# Patient Record
Sex: Female | Born: 1965 | Race: Black or African American | Hispanic: No | Marital: Single | Smoking: Current every day smoker
Health system: Southern US, Community
[De-identification: ages and names within clinical notes are randomized; demographics above are authoritative.]

## PROBLEM LIST (undated history)

## (undated) DIAGNOSIS — K219 Gastro-esophageal reflux disease without esophagitis: Secondary | ICD-10-CM

## (undated) DIAGNOSIS — M1990 Unspecified osteoarthritis, unspecified site: Secondary | ICD-10-CM

## (undated) HISTORY — PX: DILATION AND CURETTAGE OF UTERUS: SHX78

## (undated) HISTORY — PX: TONSILLECTOMY: SUR1361

---

## 2015-01-23 ENCOUNTER — Emergency Department (HOSPITAL_COMMUNITY)
Admission: EM | Admit: 2015-01-23 | Discharge: 2015-01-23 | Disposition: A | Discharge status: Home / Self Care | Payer: BLUE CROSS/BLUE SHIELD | Attending: Emergency Medicine | Admitting: Emergency Medicine

## 2015-01-23 ENCOUNTER — Encounter (HOSPITAL_COMMUNITY): Payer: Self-pay

## 2015-01-23 ENCOUNTER — Emergency Department (HOSPITAL_COMMUNITY): Payer: BLUE CROSS/BLUE SHIELD

## 2015-01-23 DIAGNOSIS — R110 Nausea: Secondary | ICD-10-CM | POA: Diagnosis not present

## 2015-01-23 DIAGNOSIS — R109 Unspecified abdominal pain: Secondary | ICD-10-CM | POA: Insufficient documentation

## 2015-01-23 DIAGNOSIS — R42 Dizziness and giddiness: Secondary | ICD-10-CM | POA: Diagnosis not present

## 2015-01-23 DIAGNOSIS — R079 Chest pain, unspecified: Secondary | ICD-10-CM | POA: Diagnosis present

## 2015-01-23 LAB — BASIC METABOLIC PANEL
Anion gap: 9 (ref 5–15)
BUN: 10 mg/dL (ref 6–20)
CO2: 25 mmol/L (ref 22–32)
Calcium: 8.9 mg/dL (ref 8.9–10.3)
Chloride: 104 mmol/L (ref 101–111)
Creatinine, Ser: 0.74 mg/dL (ref 0.44–1.00)
GFR calc Af Amer: >60 mL/min (ref >60)
GFR calc non Af Amer: >60 mL/min (ref >60)
Glucose, Bld: 108 mg/dL — ABNORMAL HIGH (ref 65–99)
Potassium: 3.4 mmol/L — ABNORMAL LOW (ref 3.5–5.1)
Sodium: 138 mmol/L (ref 135–145)

## 2015-01-23 LAB — CBC WITH DIFFERENTIAL/PLATELET
Basophils Absolute: 0 10*3/uL (ref 0.0–0.1)
Basophils Relative: 1 % (ref 0–1)
Eosinophils Absolute: 0.1 10*3/uL (ref 0.0–0.7)
Eosinophils Relative: 1 % (ref 0–5)
HCT: 36.9 % (ref 36.0–46.0)
Hemoglobin: 12.1 g/dL (ref 12.0–15.0)
Lymphocytes Relative: 22 % (ref 12–46)
Lymphs Abs: 1.8 10*3/uL (ref 0.7–4.0)
MCH: 26.5 pg (ref 26.0–34.0)
MCHC: 32.8 g/dL (ref 30.0–36.0)
MCV: 80.9 fL (ref 78.0–100.0)
Monocytes Absolute: 0.5 10*3/uL (ref 0.1–1.0)
Monocytes Relative: 6 % (ref 3–12)
Neutro Abs: 5.9 10*3/uL (ref 1.7–7.7)
Neutrophils Relative %: 70 % (ref 43–77)
Platelets: 309 10*3/uL (ref 150–400)
RBC: 4.56 MIL/uL (ref 3.87–5.11)
RDW: 14.2 % (ref 11.5–15.5)
WBC: 8.4 10*3/uL (ref 4.0–10.5)

## 2015-01-23 LAB — I-STAT TROPONIN, ED
Troponin i, poc: 0 ng/mL (ref 0.00–0.08)
Troponin i, poc: 0 ng/mL (ref 0.00–0.08)

## 2015-01-23 LAB — HEPATIC FUNCTION PANEL
ALT: 14 U/L (ref 14–54)
AST: 16 U/L (ref 15–41)
Albumin: 3.6 g/dL (ref 3.5–5.0)
Alkaline Phosphatase: 113 U/L (ref 38–126)
Bilirubin, Direct: 0.1 mg/dL (ref 0.1–0.5)
Indirect Bilirubin: 0.4 mg/dL (ref 0.3–0.9)
Total Bilirubin: 0.5 mg/dL (ref 0.3–1.2)
Total Protein: 8.1 g/dL (ref 6.5–8.1)

## 2015-01-23 LAB — LIPASE, BLOOD: Lipase: 19 U/L — ABNORMAL LOW (ref 22–51)

## 2015-01-23 MED ORDER — ONDANSETRON HCL 4 MG/2ML IJ SOLN
4.0000 mg | Freq: Once | INTRAMUSCULAR | Status: AC
  Administered 2015-01-23: 4 mg via INTRAVENOUS
  Filled 2015-01-23: qty 2

## 2015-01-23 MED ORDER — NITROGLYCERIN 0.4 MG SL SUBL
0.4000 mg | SUBLINGUAL_TABLET | SUBLINGUAL | Status: DC | PRN
  Filled 2015-01-23: qty 1

## 2015-01-23 NOTE — ED Provider Notes (Signed)
CSN: [id number]     Arrival date & time 01/23/15  0016 History  This chart was scribed for Patricia Rhine, MD by Placido Sou, ED scribe. This patient was seen in room APA04/APA04 and the patient's care was started at 12:25 AM.   Chief Complaint  Patient presents with  . Chest Pain   Patient is a 49 y.o. female presenting with chest pain. The history is provided by the patient. No language interpreter was used.  Chest Pain Pain location:  L chest Pain quality: pressure   Pain radiates to:  Does not radiate Pain severity:  Moderate Onset quality:  Sudden Duration:  2 hours Timing:  Constant Progression:  Unchanged Chronicity:  New Relieved by:  Nitroglycerin and aspirin Associated symptoms: abdominal pain, dizziness and nausea   Associated symptoms: not vomiting   Risk factors: obesity and smoking   Risk factors: no coronary artery disease and no prior DVT/PE     HPI Comments: Patricia Thompson is a 49 y.o. female, with a hx of smoking, who presents to the Emergency Department by ambulance complaining of constant, moderate, left sided chest pain with onset PTA. Pt describes her pain as a pressure,and further notes associated nausea, abd pain and dizziness. Pt notes taking 4 baby aspirin PTA and per EMS was given 1 sl nitro which provided some improvement of her pain. She denies any hx of DM, cardiac issues or DVT/PE. Pt denies any hematemesis or leg swelling.   PMH -none Soc hx - smoker Social History  Substance Use Topics  . Smoking status: Not on file  . Smokeless tobacco: Not on file  . Alcohol Use: Not on file   OB History    No data available     Review of Systems  Cardiovascular: Positive for chest pain. Negative for leg swelling.  Gastrointestinal: Positive for nausea and abdominal pain. Negative for vomiting.  Neurological: Positive for dizziness.  All other systems reviewed and are negative.  Allergies  Review of patient's allergies indicates not on  file.  Home Medications   Prior to Admission medications   Not on File   BP 138/87 mmHg  Pulse 63  Temp(Src) 98.1 F (36.7 C) (Oral)  Resp 24  Ht 5\' 1"  (1.549 m)  Wt 220 lb (99.791 kg)  BMI 41.59 kg/m2  SpO2 98% Physical Exam CONSTITUTIONAL: Well developed/well nourished HEAD: Normocephalic/atraumatic EYES: EOMI/PERRL ENMT: Mucous membranes moist NECK: supple no meningeal signs SPINE/BACK:mild diffuse tenderness CV: S1/S2 noted, no murmurs/rubs/gallops noted LUNGS: Lungs are clear to auscultation bilaterally, no apparent distress ABDOMEN: soft, nontender, no rebound or guarding, bowel sounds noted throughout abdomen GU:no cva tenderness NEURO: Pt is awake/alert/appropriate, moves all extremitiesx4.  No facial droop.   EXTREMITIES: pulses normal/equal, full ROM; no edema or calf tenderness noted SKIN: warm, color normal PSYCH: no abnormalities of mood noted, alert and oriented to situation ED Course  Procedures  DIAGNOSTIC STUDIES: Oxygen Saturation is 98% on RA, normal by my interpretation.    COORDINATION OF CARE: 12:29 AM Discussed treatment plan with pt at bedside and pt agreed to plan. 1:43 AM On repeat assessment, pt has very reproducible chest wall pain She reports this is the pain she had earlier She first had palpitations then had chest wall pain Low suspicion for ACS at this time She is low risk for PE I doubt aortic dissection given history/exam 3:26 AM Pt sleeping at this time 4:03 AM Repeat troponin negative Repeat EKG unchanged Pt sleeping on reassessment She reports mild  CP that is worsened with palpation  with some areas of reproducible CP Her main complaint earlier was palpitations Currently she is well appearing/stable I feel she is safe for d/c We discussed strict return precautions Labs Review Labs Reviewed  BASIC METABOLIC PANEL - Abnormal; Notable for the following:    Potassium 3.4 (*)    Glucose, Bld 108 (*)    All other components  within normal limits  LIPASE, BLOOD - Abnormal; Notable for the following:    Lipase 19 (*)    All other components within normal limits  CBC WITH DIFFERENTIAL/PLATELET  HEPATIC FUNCTION PANEL  I-STAT TROPOININ, ED  Rosezena Sensor, ED    Imaging Review Dg Chest 2 View  01/23/2015   CLINICAL DATA:  Acute onset of generalized chest pain, dizziness and nausea. Initial encounter.  EXAM: CHEST  2 VIEW  COMPARISON:  Chest radiograph performed 04/26/2013  FINDINGS: The lungs are well-aerated. Mild vascular congestion is noted, without significant pulmonary edema. There is no evidence of pleural effusion or pneumothorax.  The heart is mildly enlarged. No acute osseous abnormalities are seen.  IMPRESSION: Mild vascular congestion and mild cardiomegaly, without significant pulmonary edema.   Electronically Signed   By: Patricia Thompson M.D.   On: 01/23/2015 02:00   I have personally reviewed and evaluated these images and lab results as part of my medical decision-making.   EKG Interpretation   Date/Time:  Thursday January 23 2015 00:23:52 EDT Ventricular Rate:  72 PR Interval:  160 QRS Duration: 85 QT Interval:  405 QTC Calculation: 443 R Axis:   68 Text Interpretation:  Sinus rhythm Borderline repolarization abnormality  Abnormal ekg No previous ECGs available Confirmed by Bebe Shaggy  MD, DONALD  [phone]) on 01/23/2015 12:33:29 AM      EKG Interpretation  Date/Time:  Thursday January 23 2015 03:21:25 EDT Ventricular Rate:  76 PR Interval:  191 QRS Duration: 86 QT Interval:  415 QTC Calculation: 467 R Axis:   75 Text Interpretation:  Sinus rhythm Nonspecific T abnormalities, inferior leads No significant change since last tracing Confirmed by Bebe Shaggy  MD, Patricia Thompson ([id number]) on 01/23/2015 3:25:49 AM       MDM   Final diagnoses:  Chest pain, unspecified chest pain type    Nursing notes including past medical history and social history reviewed and considered in  documentation xrays/imaging reviewed by myself and considered during evaluation   I personally performed the services described in this documentation, which was scribed in my presence. The recorded information has been reviewed and is accurate.      Patricia Rhine, MD 01/23/15 [phone]

## 2015-01-23 NOTE — Discharge Instructions (Signed)

## 2015-01-23 NOTE — ED Notes (Signed)
Chest pain started approx 6 pm, 4 baby aspirin and 1 sl nitro for pain per ems with some improvement of pain from 6/10 to 3/10.  Pt also had some dizziness and nausea.

## 2018-06-07 HISTORY — PX: COLONOSCOPY: SHX174

## 2018-08-07 ENCOUNTER — Encounter (HOSPITAL_COMMUNITY)
Admission: RE | Admit: 2018-08-07 | Discharge: 2018-08-07 | Disposition: A | Discharge status: Home / Self Care | Payer: BLUE CROSS/BLUE SHIELD | Source: Ambulatory Visit | Attending: Orthopedic Surgery | Admitting: Orthopedic Surgery

## 2018-08-07 ENCOUNTER — Other Ambulatory Visit: Payer: Self-pay

## 2018-08-07 ENCOUNTER — Encounter (HOSPITAL_COMMUNITY): Payer: Self-pay

## 2018-08-07 DIAGNOSIS — Z01812 Encounter for preprocedural laboratory examination: Secondary | ICD-10-CM | POA: Diagnosis not present

## 2018-08-07 DIAGNOSIS — M1711 Unilateral primary osteoarthritis, right knee: Secondary | ICD-10-CM | POA: Insufficient documentation

## 2018-08-07 HISTORY — DX: Gastro-esophageal reflux disease without esophagitis: K21.9

## 2018-08-07 HISTORY — DX: Unspecified osteoarthritis, unspecified site: M19.90

## 2018-08-07 LAB — COMPREHENSIVE METABOLIC PANEL
ALT: 24 U/L (ref 0–44)
AST: 22 U/L (ref 15–41)
Albumin: 3.8 g/dL (ref 3.5–5.0)
Alkaline Phosphatase: 114 U/L (ref 38–126)
Anion gap: 7 (ref 5–15)
BUN: 16 mg/dL (ref 6–20)
CO2: 25 mmol/L (ref 22–32)
Calcium: 9.4 mg/dL (ref 8.9–10.3)
Chloride: 106 mmol/L (ref 98–111)
Creatinine, Ser: 0.69 mg/dL (ref 0.44–1.00)
GFR calc Af Amer: >60 mL/min (ref >60)
GFR calc non Af Amer: >60 mL/min (ref >60)
Glucose, Bld: 72 mg/dL (ref 70–99)
Potassium: 4.2 mmol/L (ref 3.5–5.1)
Sodium: 138 mmol/L (ref 135–145)
Total Bilirubin: 0.4 mg/dL (ref 0.3–1.2)
Total Protein: 8 g/dL (ref 6.5–8.1)

## 2018-08-07 LAB — CBC
HCT: 40 % (ref 36.0–46.0)
Hemoglobin: 12.4 g/dL (ref 12.0–15.0)
MCH: 27.1 pg (ref 26.0–34.0)
MCHC: 31 g/dL (ref 30.0–36.0)
MCV: 87.5 fL (ref 80.0–100.0)
Platelets: 294 10*3/uL (ref 150–400)
RBC: 4.57 MIL/uL (ref 3.87–5.11)
RDW: 14.5 % (ref 11.5–15.5)
WBC: 6.5 10*3/uL (ref 4.0–10.5)
nRBC: 0 % (ref 0.0–0.2)

## 2018-08-07 LAB — PROTIME-INR
INR: 0.9 (ref 0.8–1.2)
Prothrombin Time: 12.2 seconds (ref 11.4–15.2)

## 2018-08-07 LAB — SURGICAL PCR SCREEN
MRSA, PCR: NEGATIVE
Staphylococcus aureus: NEGATIVE

## 2018-08-07 LAB — APTT: aPTT: 36 seconds (ref 24–36)

## 2018-08-07 NOTE — Patient Instructions (Signed)
Patricia Thompson  08/07/2018      Your procedure is scheduled on:  08-14-2018   Report to [facility] Main  Entrance,  Report to admitting at  12:30 PM    Call this number if you have problems the morning of surgery [phone]       Remember: Do not eat food After Midnight.   Clear liquid diet from midnight until 9:00 AM day of surgery.  Nothing by mouth after 9:00 AM including  Including water, candy, gum, mints   BRUSH YOUR TEETH MORNING OF SURGERY AND RINSE YOUR MOUTH OUT         Take these medicines the morning of surgery with A SIP OF WATER:   NONE                                  You may not have any metal on your body including hair pins and piercings              Do not wear jewelry, make-up, lotions, powders or perfumes, deodorant              Do not wear nail polish.  Do not shave  48 hours prior to surgery.    .   Do not bring valuables to the hospital. [facility] Thompson IS NOT             RESPONSIBLE   FOR VALUABLES.  Contacts, dentures or bridgework may not be worn into surgery.  Leave suitcase in the car. After surgery it may be brought to your room.    _____________________________________________________________________    CLEAR LIQUID DIET   Foods Allowed                                                                     Foods Excluded  Coffee and tea, regular and decaf                             liquids that you cannot  Plain Jell-O in any flavor                                             see through such as: Fruit ices (not with fruit pulp)                                     milk, soups, orange juice  Iced Popsicles                                    All solid food Carbonated beverages, regular and diet  Cranberry, grape and apple juices Sports drinks like Gatorade Lightly seasoned clear broth or consume(fat free) Sugar, honey  syrup  _____________________________________________________________________            [facility] - Preparing for Surgery Before surgery, you can play an important role.  Because skin is not sterile, your skin needs to be as free of germs as possible.  You can reduce the number of germs on your skin by washing with CHG (chlorahexidine gluconate) soap before surgery.  CHG is an antiseptic cleaner which kills germs and bonds with the skin to continue killing germs even after washing. Please DO NOT use if you have an allergy to CHG or antibacterial soaps.  If your skin becomes reddened/irritated stop using the CHG and inform your nurse when you arrive at Short Stay. Do not shave (including legs and underarms) for at least 48 hours prior to the first CHG shower.  You may shave your face/neck. Please follow these instructions carefully:  1.  Shower with CHG Soap the night before surgery and the  morning of Surgery.  2.  If you choose to wash your hair, wash your hair first as usual with your  normal  shampoo.  3.  After you shampoo, rinse your hair and body thoroughly to remove the  shampoo.                            4.  Use CHG as you would any other liquid soap.  You can apply chg directly  to the skin and wash                       Gently with a scrungie or clean washcloth.  5.  Apply the CHG Soap to your body ONLY FROM THE NECK DOWN.   Do not use on face/ open                           Wound or open sores. Avoid contact with eyes, ears mouth and genitals (private parts).                       Wash face,  Genitals (private parts) with your normal soap.             6.  Wash thoroughly, paying special attention to the area where your surgery  will be performed.  7.  Thoroughly rinse your body with warm water from the neck down.  8.  DO NOT shower/wash with your normal soap after using and rinsing off  the CHG Soap.             9.  Pat yourself dry with a clean towel.            10.  Wear clean  pajamas.            11.  Place clean sheets on your bed the night of your first shower and do not  sleep with pets. Day of Surgery : Do not apply any lotions/deodorants the morning of surgery.  Please wear clean clothes to the hospital/surgery center.  FAILURE TO FOLLOW THESE INSTRUCTIONS MAY RESULT IN THE CANCELLATION OF YOUR SURGERY PATIENT SIGNATURE_________________________________  NURSE SIGNATURE__________________________________  ________________________________________________________________________   Patricia Thompson  An incentive spirometer is a tool that can help keep your lungs clear and active. This tool measures how well you are  filling your lungs with each breath. Taking long deep breaths may help reverse or decrease the chance of developing breathing (pulmonary) problems (especially infection) following:  A long period of time when you are unable to move or be active. BEFORE THE PROCEDURE   If the spirometer includes an indicator to show your best effort, your nurse or respiratory therapist will set it to a desired goal.  If possible, sit up straight or lean slightly forward. Try not to slouch.  Hold the incentive spirometer in an upright position. INSTRUCTIONS FOR USE  1. Sit on the edge of your bed if possible, or sit up as far as you can in bed or on a chair. 2. Hold the incentive spirometer in an upright position. 3. Breathe out normally. 4. Place the mouthpiece in your mouth and seal your lips tightly around it. 5. Breathe in slowly and as deeply as possible, raising the piston or the ball toward the top of the column. 6. Hold your breath for 3-5 seconds or for as long as possible. Allow the piston or ball to fall to the bottom of the column. 7. Remove the mouthpiece from your mouth and breathe out normally. 8. Rest for a few seconds and repeat Steps 1 through 7 at least 10 times every 1-2 hours when you are awake. Take your time and take a few normal breaths  between deep breaths. 9. The spirometer may include an indicator to show your best effort. Use the indicator as a goal to work toward during each repetition. 10. After each set of 10 deep breaths, practice coughing to be sure your lungs are clear. If you have an incision (the cut made at the time of surgery), support your incision when coughing by placing a pillow or rolled up towels firmly against it. Once you are able to get out of bed, walk around indoors and cough well. You may stop using the incentive spirometer when instructed by your caregiver.  RISKS AND COMPLICATIONS  Take your time so you do not get dizzy or light-headed.  If you are in pain, you may need to take or ask for pain medication before doing incentive spirometry. It is harder to take a deep breath if you are having pain. AFTER USE  Rest and breathe slowly and easily.  It can be helpful to keep track of a log of your progress. Your caregiver can provide you with a simple table to help with this. If you are using the spirometer at home, follow these instructions: SEEK MEDICAL CARE IF:   You are having difficultly using the spirometer.  You have trouble using the spirometer as often as instructed.  Your pain medication is not giving enough relief while using the spirometer.  You develop fever of 100.5 F (38.1 C) or higher. SEEK IMMEDIATE MEDICAL CARE IF:   You cough up bloody sputum that had not been present before.  You develop fever of 102 F (38.9 C) or greater.  You develop worsening pain at or near the incision site. MAKE SURE YOU:   Understand these instructions.  Will watch your condition.  Will get help right away if you are not doing well or get worse. Document Released: 10/04/2006 Document Revised: 08/16/2011 Document Reviewed: 12/05/2006 Exit[employer] Patient Information 2014 Exit[employer], [place].   ________________________________________________________________________  WHAT IS A BLOOD TRANSFUSION?  Blood Transfusion Information  A transfusion is the replacement of blood or some of its parts. Blood is made up of multiple cells which provide different  functions.  Red blood cells carry oxygen and are used for blood loss replacement.  White blood cells fight against infection.  Platelets control bleeding.  Plasma helps clot blood.  Other blood products are available for specialized needs, such as hemophilia or other clotting disorders. BEFORE THE TRANSFUSION  Who gives blood for transfusions?   Healthy volunteers who are fully evaluated to make sure their blood is safe. This is blood bank blood. Transfusion therapy is the safest it has ever been in the practice of medicine. Before blood is taken from a donor, a complete history is taken to make sure that person has no history of diseases nor engages in risky social behavior (examples are intravenous drug use or sexual activity with multiple partners). The donor's travel history is screened to minimize risk of transmitting infections, such as malaria. The donated blood is tested for signs of infectious diseases, such as HIV and hepatitis. The blood is then tested to be sure it is compatible with you in order to minimize the chance of a transfusion reaction. If you or a relative donates blood, this is often done in anticipation of surgery and is not appropriate for emergency situations. It takes many days to process the donated blood. RISKS AND COMPLICATIONS Although transfusion therapy is very safe and saves many lives, the main dangers of transfusion include:   Getting an infectious disease.  Developing a transfusion reaction. This is an allergic reaction to something in the blood you were given. Every precaution is taken to prevent this. The decision to have a blood transfusion has been considered carefully by your caregiver before blood is given. Blood is not given unless the benefits outweigh the risks. AFTER THE TRANSFUSION  Right  after receiving a blood transfusion, you will usually feel much better and more energetic. This is especially true if your red blood cells have gotten low (anemic). The transfusion raises the level of the red blood cells which carry oxygen, and this usually causes an energy increase.  The nurse administering the transfusion will monitor you carefully for complications. HOME CARE INSTRUCTIONS  No special instructions are needed after a transfusion. You may find your energy is better. Speak with your caregiver about any limitations on activity for underlying diseases you may have. SEEK MEDICAL CARE IF:   Your condition is not improving after your transfusion.  You develop redness or irritation at the intravenous (IV) site. SEEK IMMEDIATE MEDICAL CARE IF:  Any of the following symptoms occur over the next 12 hours:  Shaking chills.  You have a temperature by mouth above 102 F (38.9 C), not controlled by medicine.  Chest, back, or muscle pain.  People around you feel you are not acting correctly or are confused.  Shortness of breath or difficulty breathing.  Dizziness and fainting.  You get a rash or develop hives.  You have a decrease in urine output.  Your urine turns a dark color or changes to pink, red, or brown. Any of the following symptoms occur over the next 10 days:  You have a temperature by mouth above 102 F (38.9 C), not controlled by medicine.  Shortness of breath.  Weakness after normal activity.  The white part of the eye turns yellow (jaundice).  You have a decrease in the amount of urine or are urinating less often.  Your urine turns a dark color or changes to pink, red, or brown. Document Released: 05/21/2000 Document Revised: 08/16/2011 Document Reviewed: 01/08/2008 [facility] Patient Information 2014 [place], [place].  _______________________________________________________________________ 

## 2018-08-08 ENCOUNTER — Encounter (HOSPITAL_COMMUNITY): Payer: Self-pay

## 2018-08-08 LAB — ABO/RH: ABO/RH(D): B POS

## 2018-08-09 NOTE — H&P (Signed)
TOTAL KNEE ADMISSION H&P  Patient is being admitted for right total knee arthroplasty.  Subjective:  Chief Complaint:right knee pain.  HPI: Patricia Thompson, 53 y.o. female, has a history of pain and functional disability in the right knee due to arthritis and has failed non-surgical conservative treatments for greater than 12 weeks to includeactivity modification.  Onset of symptoms was gradual, starting 3 years ago with gradually worsening course since that time. The patient noted no past surgery on the right knee(s).  Patient currently rates pain in the right knee(s) at 9 out of 10 with activity. Patient has worsening of pain with activity and weight bearing, pain that interferes with activities of daily living and crepitus.  Patient has evidence of severe tri-compartmental arthritis with significant varus deformity and tibial subluxation by imaging studies. There is no active infection.  There are no active problems to display for this patient.  Past Medical History:  Diagnosis Date  . GERD (gastroesophageal reflux disease)    occasional  . OA (osteoarthritis)    knees    Past Surgical History:  Procedure Laterality Date  . COLONOSCOPY  06/2018  . DILATION AND CURETTAGE OF UTERUS  age 89  . TONSILLECTOMY  age 50    No current facility-administered medications for this encounter.    Current Outpatient Medications  Medication Sig Dispense Refill Last Dose  . Biotin 82423 MCG TABS Take 20,000 mcg by mouth daily.     . diclofenac (VOLTAREN) 50 MG EC tablet Take 50 mg by mouth 2 (two) times daily.     Marland Kitchen ibuprofen (ADVIL,MOTRIN) 200 MG tablet Take 400 mg by mouth every morning.     Marland Kitchen OVER THE COUNTER MEDICATION Take 2 tablets by mouth daily. Keratin     . omeprazole (PRILOSEC) 20 MG capsule Take 20 mg by mouth as needed.      Allergies  Allergen Reactions  . Vicodin [Hydrocodone-Acetaminophen] Nausea And Vomiting    Social History   Tobacco Use  . Smoking status: Current Every  Day Smoker    Packs/day: 0.25    Years: 35.00    Pack years: 8.75    Types: Cigarettes  . Smokeless tobacco: Never Used  . Tobacco comment: per pt 1ppwk  Substance Use Topics  . Alcohol use: No    No family history on file.   Review of Systems  Constitutional: Negative for chills and fever.  HENT: Negative for congestion, sore throat and tinnitus.   Eyes: Negative for double vision, photophobia and pain.  Respiratory: Negative for cough, shortness of breath and wheezing.   Cardiovascular: Negative for chest pain, palpitations and orthopnea.  Gastrointestinal: Negative for heartburn, nausea and vomiting.  Genitourinary: Negative for dysuria, frequency and urgency.  Musculoskeletal: Positive for joint pain.  Neurological: Negative for dizziness, weakness and headaches.    Objective:  Physical Exam  Well nourished and well developed.  General: Alert and oriented x3, cooperative and pleasant, no acute distress.  Head: normocephalic, atraumatic, neck supple.  Eyes: EOMI.  Respiratory: breath sounds clear in all fields, no wheezing, rales, or rhonchi. Cardiovascular: Regular rate and rhythm, no murmurs, gallops or rubs.  Abdomen: non-tender to palpation and soft, normoactive bowel sounds. Musculoskeletal:  Right Knee Exam: No effusion. No Swelling. Range of motion is 20-50 degrees, she is fixed in 20 degrees of flexion and cannot flex down below 50 degrees. No crepitus on range of motion of the knee. Diffuse tenderness, the worst on the medial side. Stable knee.  Calves  soft and nontender. Motor function intact in LE. Strength 5/5 LE bilaterally. Neuro: Distal pulses 2+. Sensation to light touch intact in LE.  Vital signs in last 24 hours:  Blood pressure: 126/82 mmHg  Labs:   Estimated body mass index is 39.3 kg/m as calculated from the following:   Height as of 08/07/18: 5' (1.524 m).   Weight as of 08/07/18: 91.3 kg.   Imaging Review Plain radiographs demonstrate severe  degenerative joint disease of the right knee(s). The overall alignment issignificant varus. The bone quality appears to be adequate for age and reported activity level.      Assessment/Plan:  End stage arthritis, right knee   The patient history, physical examination, clinical judgment of the provider and imaging studies are consistent with end stage degenerative joint disease of the right knee(s) and total knee arthroplasty is deemed medically necessary. The treatment options including medical management, injection therapy arthroscopy and arthroplasty were discussed at length. The risks and benefits of total knee arthroplasty were presented and reviewed. The risks due to aseptic loosening, infection, stiffness, patella tracking problems, thromboembolic complications and other imponderables were discussed. The patient acknowledged the explanation, agreed to proceed with the plan and consent was signed. Patient is being admitted for inpatient treatment for surgery, pain control, PT, OT, prophylactic antibiotics, VTE prophylaxis, progressive ambulation and ADL's and discharge planning. The patient is planning to be discharged home.    Anticipated LOS equal to or greater than 2 midnights due to - Age 46 and older with one or more of the following:  - Obesity  - Expected need for hospital services (PT, OT, Nursing) required for safe  discharge  - Anticipated need for postoperative skilled nursing care or inpatient rehab  - Active co-morbidities: None OR   - Unanticipated findings during/Post Surgery: None  - Patient is a high risk of re-admission due to: None   Therapy Plans: outpatient therapy at [facility] Disposition: Home with sibling Planned DVT Prophylaxis: Aspirin 325 mg BID DME needed: None PCP: Kirstie Peri, MD TXA: IV Allergies: Vicodin (nausea) Anesthesia Concerns: None BMI: 39.5  - Patient was instructed on what medications to stop prior to surgery. - Follow-up  visit in 2 weeks with Dr. Lequita Halt - Begin physical therapy following surgery - Pre-operative lab work as pre-surgical testing - Prescriptions will be provided in hospital at time of discharge  Arther Abbott, PA-C Orthopedic Surgery [facility] [place]

## 2018-08-13 MED ORDER — BUPIVACAINE LIPOSOME 1.3 % IJ SUSP
20.0000 mL | INTRAMUSCULAR | Status: DC
  Filled 2018-08-13: qty 20

## 2018-08-14 ENCOUNTER — Encounter (HOSPITAL_COMMUNITY)
Admission: AD | Disposition: A | Discharge status: Home / Self Care | Payer: Self-pay | Source: Home / Self Care | Attending: Orthopedic Surgery

## 2018-08-14 ENCOUNTER — Ambulatory Visit (HOSPITAL_COMMUNITY): Payer: BLUE CROSS/BLUE SHIELD | Admitting: Anesthesiology

## 2018-08-14 ENCOUNTER — Inpatient Hospital Stay (HOSPITAL_COMMUNITY)
Admission: AD | Admit: 2018-08-14 | Discharge: 2018-08-16 | DRG: 470 | Disposition: A | Discharge status: Home / Self Care | Payer: BLUE CROSS/BLUE SHIELD | Attending: Orthopedic Surgery | Admitting: Orthopedic Surgery

## 2018-08-14 ENCOUNTER — Other Ambulatory Visit: Payer: Self-pay

## 2018-08-14 ENCOUNTER — Ambulatory Visit (HOSPITAL_COMMUNITY): Payer: BLUE CROSS/BLUE SHIELD | Admitting: Physician Assistant

## 2018-08-14 ENCOUNTER — Encounter (HOSPITAL_COMMUNITY): Payer: Self-pay | Admitting: *Deleted

## 2018-08-14 ENCOUNTER — Telehealth (HOSPITAL_COMMUNITY): Payer: Self-pay | Admitting: *Deleted

## 2018-08-14 DIAGNOSIS — K219 Gastro-esophageal reflux disease without esophagitis: Secondary | ICD-10-CM | POA: Diagnosis present

## 2018-08-14 DIAGNOSIS — E669 Obesity, unspecified: Secondary | ICD-10-CM | POA: Diagnosis present

## 2018-08-14 DIAGNOSIS — Z79899 Other long term (current) drug therapy: Secondary | ICD-10-CM

## 2018-08-14 DIAGNOSIS — M179 Osteoarthritis of knee, unspecified: Secondary | ICD-10-CM | POA: Diagnosis present

## 2018-08-14 DIAGNOSIS — Z885 Allergy status to narcotic agent status: Secondary | ICD-10-CM

## 2018-08-14 DIAGNOSIS — Z791 Long term (current) use of non-steroidal anti-inflammatories (NSAID): Secondary | ICD-10-CM

## 2018-08-14 DIAGNOSIS — M1711 Unilateral primary osteoarthritis, right knee: Principal | ICD-10-CM | POA: Diagnosis present

## 2018-08-14 DIAGNOSIS — F17210 Nicotine dependence, cigarettes, uncomplicated: Secondary | ICD-10-CM | POA: Diagnosis present

## 2018-08-14 DIAGNOSIS — Z6839 Body mass index (BMI) 39.0-39.9, adult: Secondary | ICD-10-CM

## 2018-08-14 DIAGNOSIS — M1710 Unilateral primary osteoarthritis, unspecified knee: Secondary | ICD-10-CM | POA: Diagnosis present

## 2018-08-14 HISTORY — PX: TOTAL KNEE ARTHROPLASTY: SHX125

## 2018-08-14 LAB — TYPE AND SCREEN
ABO/RH(D): B POS
Antibody Screen: NEGATIVE

## 2018-08-14 SURGERY — ARTHROPLASTY, KNEE, TOTAL
Anesthesia: Spinal | Site: Knee | Laterality: Right

## 2018-08-14 MED ORDER — BUPIVACAINE LIPOSOME 1.3 % IJ SUSP
INTRAMUSCULAR | Status: DC | PRN
  Administered 2018-08-14: 20 mL

## 2018-08-14 MED ORDER — FENTANYL CITRATE (PF) 100 MCG/2ML IJ SOLN
50.0000 ug | INTRAMUSCULAR | Status: DC
  Administered 2018-08-14: 100 ug via INTRAVENOUS

## 2018-08-14 MED ORDER — MIDAZOLAM HCL 2 MG/2ML IJ SOLN
INTRAMUSCULAR | Status: AC
  Administered 2018-08-14: 2 mg via INTRAVENOUS
  Filled 2018-08-14: qty 2

## 2018-08-14 MED ORDER — PROPOFOL 10 MG/ML IV BOLUS
INTRAVENOUS | Status: AC
  Filled 2018-08-14: qty 20

## 2018-08-14 MED ORDER — SODIUM CHLORIDE 0.9 % IR SOLN
Status: DC | PRN
  Administered 2018-08-14: 1000 mL

## 2018-08-14 MED ORDER — BISACODYL 10 MG RE SUPP: 10.0000 mg | Freq: Every day | RECTAL | Status: DC | PRN

## 2018-08-14 MED ORDER — PHENOL 1.4 % MT LIQD
1.0000 | OROMUCOSAL | Status: DC | PRN
  Filled 2018-08-14: qty 177

## 2018-08-14 MED ORDER — METOCLOPRAMIDE HCL 5 MG/ML IJ SOLN: 5.0000 mg | Freq: Three times a day (TID) | INTRAMUSCULAR | Status: DC | PRN

## 2018-08-14 MED ORDER — CHLORHEXIDINE GLUCONATE 4 % EX LIQD: 60.0000 mL | Freq: Once | CUTANEOUS | Status: DC

## 2018-08-14 MED ORDER — PROPOFOL 10 MG/ML IV BOLUS
INTRAVENOUS | Status: DC | PRN
  Administered 2018-08-14 (×2): 20 mg via INTRAVENOUS
  Administered 2018-08-14: 40 mg via INTRAVENOUS
  Administered 2018-08-14 (×3): 20 mg via INTRAVENOUS

## 2018-08-14 MED ORDER — DIPHENHYDRAMINE HCL 12.5 MG/5ML PO ELIX: 12.5000 mg | ORAL_SOLUTION | ORAL | Status: DC | PRN

## 2018-08-14 MED ORDER — PANTOPRAZOLE SODIUM 40 MG PO TBEC: 40.0000 mg | DELAYED_RELEASE_TABLET | Freq: Every day | ORAL | Status: DC | PRN

## 2018-08-14 MED ORDER — ONDANSETRON HCL 4 MG PO TABS: 4.0000 mg | ORAL_TABLET | Freq: Four times a day (QID) | ORAL | Status: DC | PRN

## 2018-08-14 MED ORDER — MIDAZOLAM HCL 2 MG/2ML IJ SOLN
1.0000 mg | INTRAMUSCULAR | Status: DC
  Administered 2018-08-14: 2 mg via INTRAVENOUS

## 2018-08-14 MED ORDER — LACTATED RINGERS IV SOLN: INTRAVENOUS | Status: DC

## 2018-08-14 MED ORDER — FLEET ENEMA 7-19 GM/118ML RE ENEM: 1.0000 | ENEMA | Freq: Once | RECTAL | Status: DC | PRN

## 2018-08-14 MED ORDER — SODIUM CHLORIDE 0.9 % IV SOLN
INTRAVENOUS | Status: DC | PRN
  Administered 2018-08-14: 40 ug/min via INTRAVENOUS

## 2018-08-14 MED ORDER — TRANEXAMIC ACID-NACL 1000-0.7 MG/100ML-% IV SOLN
1000.0000 mg | Freq: Once | INTRAVENOUS | Status: AC
  Administered 2018-08-14: 1000 mg via INTRAVENOUS
  Filled 2018-08-14: qty 100

## 2018-08-14 MED ORDER — CEFAZOLIN SODIUM-DEXTROSE 2-4 GM/100ML-% IV SOLN
2.0000 g | Freq: Four times a day (QID) | INTRAVENOUS | Status: AC
  Administered 2018-08-14 – 2018-08-15 (×2): 2 g via INTRAVENOUS
  Filled 2018-08-14 (×2): qty 100

## 2018-08-14 MED ORDER — TRAMADOL HCL 50 MG PO TABS
50.0000 mg | ORAL_TABLET | Freq: Four times a day (QID) | ORAL | Status: DC | PRN
  Administered 2018-08-15: 100 mg via ORAL
  Administered 2018-08-15: 50 mg via ORAL
  Administered 2018-08-16 (×2): 100 mg via ORAL
  Filled 2018-08-14 (×2): qty 2
  Filled 2018-08-14: qty 1
  Filled 2018-08-14: qty 2

## 2018-08-14 MED ORDER — FENTANYL CITRATE (PF) 100 MCG/2ML IJ SOLN
25.0000 ug | INTRAMUSCULAR | Status: DC | PRN
  Administered 2018-08-14 (×3): 50 ug via INTRAVENOUS

## 2018-08-14 MED ORDER — METOCLOPRAMIDE HCL 5 MG PO TABS: 5.0000 mg | ORAL_TABLET | Freq: Three times a day (TID) | ORAL | Status: DC | PRN

## 2018-08-14 MED ORDER — ROPIVACAINE HCL 7.5 MG/ML IJ SOLN
INTRAMUSCULAR | Status: DC | PRN
  Administered 2018-08-14: 20 mL via PERINEURAL

## 2018-08-14 MED ORDER — ONDANSETRON HCL 4 MG/2ML IJ SOLN: 4.0000 mg | Freq: Four times a day (QID) | INTRAMUSCULAR | Status: DC | PRN

## 2018-08-14 MED ORDER — ACETAMINOPHEN 10 MG/ML IV SOLN
1000.0000 mg | Freq: Four times a day (QID) | INTRAVENOUS | Status: DC
  Administered 2018-08-14: 1000 mg via INTRAVENOUS
  Filled 2018-08-14: qty 100

## 2018-08-14 MED ORDER — CEFAZOLIN SODIUM-DEXTROSE 2-4 GM/100ML-% IV SOLN
2.0000 g | INTRAVENOUS | Status: AC
  Administered 2018-08-14: 2 g via INTRAVENOUS
  Filled 2018-08-14: qty 100

## 2018-08-14 MED ORDER — FENTANYL CITRATE (PF) 100 MCG/2ML IJ SOLN
INTRAMUSCULAR | Status: AC
  Filled 2018-08-14: qty 4

## 2018-08-14 MED ORDER — METHOCARBAMOL 500 MG PO TABS
500.0000 mg | ORAL_TABLET | Freq: Four times a day (QID) | ORAL | Status: DC | PRN
  Administered 2018-08-15 – 2018-08-16 (×5): 500 mg via ORAL
  Filled 2018-08-14 (×5): qty 1

## 2018-08-14 MED ORDER — SODIUM CHLORIDE (PF) 0.9 % IJ SOLN
INTRAMUSCULAR | Status: AC
  Filled 2018-08-14: qty 10

## 2018-08-14 MED ORDER — GABAPENTIN 300 MG PO CAPS
300.0000 mg | ORAL_CAPSULE | Freq: Three times a day (TID) | ORAL | Status: DC
  Administered 2018-08-14 – 2018-08-16 (×5): 300 mg via ORAL
  Filled 2018-08-14 (×5): qty 1

## 2018-08-14 MED ORDER — DEXAMETHASONE SODIUM PHOSPHATE 10 MG/ML IJ SOLN
8.0000 mg | Freq: Once | INTRAMUSCULAR | Status: AC
  Administered 2018-08-14: 8 mg via INTRAVENOUS

## 2018-08-14 MED ORDER — TRANEXAMIC ACID-NACL 1000-0.7 MG/100ML-% IV SOLN
1000.0000 mg | INTRAVENOUS | Status: AC
  Administered 2018-08-14: 1000 mg via INTRAVENOUS
  Filled 2018-08-14: qty 100

## 2018-08-14 MED ORDER — MENTHOL 3 MG MT LOZG: 1.0000 | LOZENGE | OROMUCOSAL | Status: DC | PRN

## 2018-08-14 MED ORDER — PROPOFOL 10 MG/ML IV BOLUS
INTRAVENOUS | Status: AC
  Filled 2018-08-14: qty 60

## 2018-08-14 MED ORDER — SODIUM CHLORIDE (PF) 0.9 % IJ SOLN
INTRAMUSCULAR | Status: AC
  Filled 2018-08-14: qty 50

## 2018-08-14 MED ORDER — STERILE WATER FOR IRRIGATION IR SOLN
Status: DC | PRN
  Administered 2018-08-14: 2000 mL

## 2018-08-14 MED ORDER — DEXAMETHASONE SODIUM PHOSPHATE 10 MG/ML IJ SOLN
10.0000 mg | Freq: Once | INTRAMUSCULAR | Status: AC
  Administered 2018-08-15: 10 mg via INTRAVENOUS
  Filled 2018-08-14: qty 1

## 2018-08-14 MED ORDER — ASPIRIN EC 325 MG PO TBEC
325.0000 mg | DELAYED_RELEASE_TABLET | Freq: Two times a day (BID) | ORAL | Status: DC
  Administered 2018-08-15 – 2018-08-16 (×3): 325 mg via ORAL
  Filled 2018-08-14 (×3): qty 1

## 2018-08-14 MED ORDER — METHOCARBAMOL 500 MG IVPB - SIMPLE MED
INTRAVENOUS | Status: AC
  Filled 2018-08-14: qty 50

## 2018-08-14 MED ORDER — OXYCODONE HCL 5 MG PO TABS
5.0000 mg | ORAL_TABLET | ORAL | Status: DC | PRN
  Administered 2018-08-14 – 2018-08-16 (×7): 10 mg via ORAL
  Filled 2018-08-14 (×7): qty 2

## 2018-08-14 MED ORDER — PROPOFOL 500 MG/50ML IV EMUL
INTRAVENOUS | Status: DC | PRN
  Administered 2018-08-14: 85 ug/kg/min via INTRAVENOUS

## 2018-08-14 MED ORDER — BUPIVACAINE IN DEXTROSE 0.75-8.25 % IT SOLN
INTRATHECAL | Status: DC | PRN
  Administered 2018-08-14: 1.5 mL via INTRATHECAL

## 2018-08-14 MED ORDER — SODIUM CHLORIDE (PF) 0.9 % IJ SOLN
INTRAMUSCULAR | Status: DC | PRN
  Administered 2018-08-14: 60 mL

## 2018-08-14 MED ORDER — METHOCARBAMOL 500 MG IVPB - SIMPLE MED
500.0000 mg | Freq: Four times a day (QID) | INTRAVENOUS | Status: DC | PRN
  Administered 2018-08-14: 500 mg via INTRAVENOUS
  Filled 2018-08-14: qty 50

## 2018-08-14 MED ORDER — MORPHINE SULFATE (PF) 2 MG/ML IV SOLN
1.0000 mg | INTRAVENOUS | Status: DC | PRN
  Administered 2018-08-14: 1 mg via INTRAVENOUS
  Filled 2018-08-14: qty 1

## 2018-08-14 MED ORDER — DOCUSATE SODIUM 100 MG PO CAPS
100.0000 mg | ORAL_CAPSULE | Freq: Two times a day (BID) | ORAL | Status: DC
  Administered 2018-08-14 – 2018-08-16 (×4): 100 mg via ORAL
  Filled 2018-08-14 (×4): qty 1

## 2018-08-14 MED ORDER — POLYETHYLENE GLYCOL 3350 17 G PO PACK: 17.0000 g | PACK | Freq: Every day | ORAL | Status: DC | PRN

## 2018-08-14 MED ORDER — ACETAMINOPHEN 500 MG PO TABS
1000.0000 mg | ORAL_TABLET | Freq: Four times a day (QID) | ORAL | Status: AC
  Administered 2018-08-14 – 2018-08-15 (×4): 1000 mg via ORAL
  Filled 2018-08-14 (×4): qty 2

## 2018-08-14 MED ORDER — SODIUM CHLORIDE 0.9 % IV SOLN
INTRAVENOUS | Status: DC
  Administered 2018-08-14: 18:00:00 via INTRAVENOUS

## 2018-08-14 MED ORDER — METOCLOPRAMIDE HCL 5 MG/ML IJ SOLN: 10.0000 mg | Freq: Once | INTRAMUSCULAR | Status: DC | PRN

## 2018-08-14 MED ORDER — LACTATED RINGERS IV SOLN
INTRAVENOUS | Status: DC
  Administered 2018-08-14 (×2): via INTRAVENOUS

## 2018-08-14 MED ORDER — MEPERIDINE HCL 50 MG/ML IJ SOLN: 6.2500 mg | INTRAMUSCULAR | Status: DC | PRN

## 2018-08-14 SURGICAL SUPPLY — 64 items
ATTUNE MED DOME PAT 38 KNEE (Knees) ×2 IMPLANT
ATTUNE PS FEM RT SZ 5 CEM KNEE (Femur) ×2 IMPLANT
ATTUNE PSRP INSR SZ 5 10M KNEE (Insert) ×2 IMPLANT
BAG ZIPLOCK 12X15 (MISCELLANEOUS) ×2 IMPLANT
BANDAGE ACE 6X5 VEL STRL LF (GAUZE/BANDAGES/DRESSINGS) ×2 IMPLANT
BASEPLATE TIBIAL ROTATING SZ 4 (Knees) ×2 IMPLANT
BLADE SAG 18X100X1.27 (BLADE) ×2 IMPLANT
BLADE SAW SGTL 11.0X1.19X90.0M (BLADE) ×2 IMPLANT
BLADE SURG SZ10 CARB STEEL (BLADE) IMPLANT
BOWL SMART MIX CTS (DISPOSABLE) ×2 IMPLANT
CEMENT HV SMART SET (Cement) ×4 IMPLANT
CHLORAPREP W/TINT 26 (MISCELLANEOUS) ×2 IMPLANT
COVER SURGICAL LIGHT HANDLE (MISCELLANEOUS) ×2 IMPLANT
COVER WAND RF STERILE (DRAPES) IMPLANT
CUFF TOURN SGL QUICK 34 (TOURNIQUET CUFF) ×1
CUFF TRNQT CYL 34X4.125X (TOURNIQUET CUFF) ×1 IMPLANT
DECANTER SPIKE VIAL GLASS SM (MISCELLANEOUS) ×4 IMPLANT
DRAPE U-SHAPE 47X51 STRL (DRAPES) ×2 IMPLANT
DRSG ADAPTIC 3X8 NADH LF (GAUZE/BANDAGES/DRESSINGS) ×2 IMPLANT
DRSG PAD ABDOMINAL 8X10 ST (GAUZE/BANDAGES/DRESSINGS) ×2 IMPLANT
DURAPREP 26ML APPLICATOR (WOUND CARE) IMPLANT
ELECT REM PT RETURN 15FT ADLT (MISCELLANEOUS) ×2 IMPLANT
EVACUATOR 1/8 PVC DRAIN (DRAIN) ×2 IMPLANT
GAUZE SPONGE 4X4 12PLY STRL (GAUZE/BANDAGES/DRESSINGS) ×2 IMPLANT
GLOVE BIO SURGEON STRL SZ7 (GLOVE) IMPLANT
GLOVE BIO SURGEON STRL SZ8 (GLOVE) ×2 IMPLANT
GLOVE BIOGEL PI IND STRL 6.5 (GLOVE) ×1 IMPLANT
GLOVE BIOGEL PI IND STRL 7.0 (GLOVE) IMPLANT
GLOVE BIOGEL PI IND STRL 7.5 (GLOVE) ×3 IMPLANT
GLOVE BIOGEL PI IND STRL 8 (GLOVE) ×1 IMPLANT
GLOVE BIOGEL PI INDICATOR 6.5 (GLOVE) ×1
GLOVE BIOGEL PI INDICATOR 7.0 (GLOVE)
GLOVE BIOGEL PI INDICATOR 7.5 (GLOVE) ×3
GLOVE BIOGEL PI INDICATOR 8 (GLOVE) ×1
GLOVE SURG SS PI 6.5 STRL IVOR (GLOVE) ×2 IMPLANT
GLOVE SURG SS PI 7.0 STRL IVOR (GLOVE) ×2 IMPLANT
GLOVE SURG SS PI 7.5 STRL IVOR (GLOVE) ×2 IMPLANT
GOWN STRL REIN 2XL XLG LVL4 (GOWN DISPOSABLE) ×2 IMPLANT
GOWN STRL REUS W/TWL LRG LVL3 (GOWN DISPOSABLE) ×6 IMPLANT
HANDPIECE INTERPULSE COAX TIP (DISPOSABLE) ×1
HOLDER FOLEY CATH W/STRAP (MISCELLANEOUS) ×2 IMPLANT
IMMOBILIZER KNEE 20 (SOFTGOODS) ×2
IMMOBILIZER KNEE 20 THIGH 36 (SOFTGOODS) ×1 IMPLANT
KIT TURNOVER KIT A (KITS) IMPLANT
MANIFOLD NEPTUNE II (INSTRUMENTS) ×2 IMPLANT
NS IRRIG 1000ML POUR BTL (IV SOLUTION) ×2 IMPLANT
PACK TOTAL KNEE CUSTOM (KITS) ×2 IMPLANT
PAD CAST 4YDX4 CTTN HI CHSV (CAST SUPPLIES) ×1 IMPLANT
PADDING CAST COTTON 4X4 STRL (CAST SUPPLIES) ×1
PADDING CAST COTTON 6X4 STRL (CAST SUPPLIES) ×2 IMPLANT
PIN STEINMAN FIXATION KNEE (PIN) ×2 IMPLANT
PROTECTOR NERVE ULNAR (MISCELLANEOUS) ×2 IMPLANT
SET HNDPC FAN SPRY TIP SCT (DISPOSABLE) ×1 IMPLANT
STRIP CLOSURE SKIN 1/2X4 (GAUZE/BANDAGES/DRESSINGS) ×4 IMPLANT
SUT MNCRL AB 4-0 PS2 18 (SUTURE) ×2 IMPLANT
SUT STRATAFIX 0 PDS 27 VIOLET (SUTURE) ×2
SUT VIC AB 2-0 CT1 27 (SUTURE) ×3
SUT VIC AB 2-0 CT1 TAPERPNT 27 (SUTURE) ×3 IMPLANT
SUTURE STRATFX 0 PDS 27 VIOLET (SUTURE) ×1 IMPLANT
TRAY FOLEY CATH 14FRSI W/METER (CATHETERS) ×2 IMPLANT
TRAY FOLEY MTR SLVR 16FR STAT (SET/KITS/TRAYS/PACK) IMPLANT
WATER STERILE IRR 1000ML POUR (IV SOLUTION) ×4 IMPLANT
WRAP KNEE MAXI GEL POST OP (GAUZE/BANDAGES/DRESSINGS) ×2 IMPLANT
YANKAUER SUCT BULB TIP 10FT TU (MISCELLANEOUS) ×2 IMPLANT

## 2018-08-14 NOTE — Anesthesia Postprocedure Evaluation (Signed)
Anesthesia Post Note  Patient: Patricia Thompson  Procedure(s) Performed: TOTAL KNEE ARTHROPLASTY (Right Knee)     Patient location during evaluation: PACU Anesthesia Type: Spinal Level of consciousness: awake and alert Pain management: pain level controlled Vital Signs Assessment: post-procedure vital signs reviewed and stable Respiratory status: spontaneous breathing and respiratory function stable Cardiovascular status: blood pressure returned to baseline and stable Postop Assessment: no headache, no backache, spinal receding and no apparent nausea or vomiting Anesthetic complications: no    Last Vitals:  Vitals:   08/14/18 1725 08/14/18 1754  BP: 108/69 112/63  Pulse: 64 (!) 56  Resp: 11 14  Temp:  36.4 C  SpO2: 94% 100%    Last Pain:  Vitals:   08/14/18 1725  TempSrc:   PainSc: Asleep                 Phillips Grout

## 2018-08-14 NOTE — Discharge Instructions (Signed)
° °Dr. Frank Aluisio °Total Joint Specialist °[facility] °[address] °[place], [place] [address] °[phone] ° °TOTAL KNEE REPLACEMENT POSTOPERATIVE DIRECTIONS ° °Knee Rehabilitation, Guidelines Following Surgery  °Results after knee surgery are often greatly improved when you follow the exercise, range of motion and muscle strengthening exercises prescribed by your doctor. Safety measures are also important to protect the knee from further injury. Any time any of these exercises cause you to have increased pain or swelling in your knee joint, decrease the amount until you are comfortable again and slowly increase them. If you have problems or questions, call your caregiver or physical therapist for advice.  ° °HOME CARE INSTRUCTIONS  °• Remove items at home which could result in a fall. This includes throw rugs or furniture in walking pathways.  °· ICE to the affected knee every three hours for 30 minutes at a time and then as needed for pain and swelling.  Continue to use ice on the knee for pain and swelling from surgery. You may notice swelling that will progress down to the foot and ankle.  This is normal after surgery.  Elevate the leg when you are not up walking on it.   °· Continue to use the breathing machine which will help keep your temperature down.  It is common for your temperature to cycle up and down following surgery, especially at night when you are not up moving around and exerting yourself.  The breathing machine keeps your lungs expanded and your temperature down. °· Do not place pillow under knee, focus on keeping the knee straight while resting ° °DIET °You may resume your previous home diet once your are discharged from the hospital. ° °DRESSING / WOUND CARE / SHOWERING °You may shower 3 days after surgery, but keep the wounds dry during showering.  You may use an occlusive plastic wrap (Press'n Seal for example), NO SOAKING/SUBMERGING IN THE BATHTUB.  If the bandage  gets wet, change with a clean dry gauze.  If the incision gets wet, pat the wound dry with a clean towel. °You may start showering once you are discharged home but do not submerge the incision under water. Just pat the incision dry and apply a dry gauze dressing on daily. °Change the surgical dressing daily and reapply a dry dressing each time. ° °ACTIVITY °Walk with your walker as instructed. °Use walker as long as suggested by your caregivers. °Avoid periods of inactivity such as sitting longer than an hour when not asleep. This helps prevent blood clots.  °You may resume a sexual relationship in one month or when given the OK by your doctor.  °You may return to work once you are cleared by your doctor.  °Do not drive a car for 6 weeks or until released by you surgeon.  °Do not drive while taking narcotics. ° °WEIGHT BEARING °Weight bearing as tolerated with assist device (walker, cane, etc) as directed, use it as long as suggested by your surgeon or therapist, typically at least 4-6 weeks. ° °POSTOPERATIVE CONSTIPATION PROTOCOL °Constipation - defined medically as fewer than three stools per week and severe constipation as less than one stool per week. ° °One of the most common issues patients have following surgery is constipation.  Even if you have a regular bowel pattern at home, your normal regimen is likely to be disrupted due to multiple reasons following surgery.  Combination of anesthesia, postoperative narcotics, change in appetite and fluid intake all can affect your bowels.    In order to avoid complications following surgery, here are some recommendations in order to help you during your recovery period. ° °Colace (docusate) - Pick up an over-the-counter form of Colace or another stool softener and take twice a day as long as you are requiring postoperative pain medications.  Take with a full glass of water daily.  If you experience loose stools or diarrhea, hold the colace until you stool forms back  up.  If your symptoms do not get better within 1 week or if they get worse, check with your doctor. ° °Dulcolax (bisacodyl) - Pick up over-the-counter and take as directed by the product packaging as needed to assist with the movement of your bowels.  Take with a full glass of water.  Use this product as needed if not relieved by Colace only.  ° °MiraLax (polyethylene glycol) - Pick up over-the-counter to have on hand.  MiraLax is a solution that will increase the amount of water in your bowels to assist with bowel movements.  Take as directed and can mix with a glass of water, juice, soda, coffee, or tea.  Take if you go more than two days without a movement. °Do not use MiraLax more than once per day. Call your doctor if you are still constipated or irregular after using this medication for 7 days in a row. ° °If you continue to have problems with postoperative constipation, please contact the office for further assistance and recommendations.  If you experience "the worst abdominal pain ever" or develop nausea or vomiting, please contact the office immediatly for further recommendations for treatment. ° °ITCHING ° If you experience itching with your medications, try taking only a single pain pill, or even half a pain pill at a time.  You can also use Benadryl over the counter for itching or also to help with sleep.  ° °TED HOSE STOCKINGS °Wear the elastic stockings on both legs for three weeks following surgery during the day but you may remove then at night for sleeping. ° °MEDICATIONS °See your medication summary on the “After Visit Summary” that the nursing staff will review with you prior to discharge.  You may have some home medications which will be placed on hold until you complete the course of blood thinner medication.  It is important for you to complete the blood thinner medication as prescribed by your surgeon.  Continue your approved medications as instructed at time of discharge. ° °PRECAUTIONS °If  you experience chest pain or shortness of breath - call 911 immediately for transfer to the hospital emergency department.  °If you develop a fever greater that 101 F, purulent drainage from wound, increased redness or drainage from wound, foul odor from the wound/dressing, or calf pain - CONTACT YOUR SURGEON.   °                                                °FOLLOW-UP APPOINTMENTS °Make sure you keep all of your appointments after your operation with your surgeon and caregivers. You should call the office at the above phone number and make an appointment for approximately two weeks after the date of your surgery or on the date instructed by your surgeon outlined in the "After Visit Summary". ° ° °RANGE OF MOTION AND STRENGTHENING EXERCISES  °Rehabilitation of the knee is important following a knee injury or   an operation. After just a few days of immobilization, the muscles of the thigh which control the knee become weakened and shrink (atrophy). Knee exercises are designed to build up the tone and strength of the thigh muscles and to improve knee motion. Often times heat used for twenty to thirty minutes before working out will loosen up your tissues and help with improving the range of motion but do not use heat for the first two weeks following surgery. These exercises can be done on a training (exercise) mat, on the floor, on a table or on a bed. Use what ever works the best and is most comfortable for you Knee exercises include:  °• Leg Lifts - While your knee is still immobilized in a splint or cast, you can do straight leg raises. Lift the leg to 60 degrees, hold for 3 sec, and slowly lower the leg. Repeat 10-20 times 2-3 times daily. Perform this exercise against resistance later as your knee gets better.  °• Quad and Hamstring Sets - Tighten up the muscle on the front of the thigh (Quad) and hold for 5-10 sec. Repeat this 10-20 times hourly. Hamstring sets are done by pushing the foot backward against an  object and holding for 5-10 sec. Repeat as with quad sets.  °· Leg Slides: Lying on your back, slowly slide your foot toward your buttocks, bending your knee up off the floor (only go as far as is comfortable). Then slowly slide your foot back down until your leg is flat on the floor again. °· Angel Wings: Lying on your back spread your legs to the side as far apart as you can without causing discomfort.  °A rehabilitation program following serious knee injuries can speed recovery and prevent re-injury in the future due to weakened muscles. Contact your doctor or a physical therapist for more information on knee rehabilitation.  ° °IF YOU ARE TRANSFERRED TO A SKILLED REHAB FACILITY °If the patient is transferred to a skilled rehab facility following release from the hospital, a list of the current medications will be sent to the facility for the patient to continue.  When discharged from the skilled rehab facility, please have the facility set up the patient's Home Health Physical Therapy prior to being released. Also, the skilled facility will be responsible for providing the patient with their medications at time of release from the facility to include their pain medication, the muscle relaxants, and their blood thinner medication. If the patient is still at the rehab facility at time of the two week follow up appointment, the skilled rehab facility will also need to assist the patient in arranging follow up appointment in our office and any transportation needs. ° °MAKE SURE YOU:  °• Understand these instructions.  °• Get help right away if you are not doing well or get worse.  ° ° °Pick up stool softner and laxative for home use following surgery while on pain medications. °Do not submerge incision under water. °Please use good hand washing techniques while changing dressing each day. °May shower starting three days after surgery. °Please use a clean towel to pat the incision dry following showers. °Continue to  use ice for pain and swelling after surgery. °Do not use any lotions or creams on the incision until instructed by your surgeon. ° °

## 2018-08-14 NOTE — Anesthesia Procedure Notes (Signed)
Spinal  Patient location during procedure: OR Preanesthetic Checklist Completed: patient identified, site marked, surgical consent, pre-op evaluation, timeout performed, IV checked, risks and benefits discussed and monitors and equipment checked Spinal Block Patient position: sitting Prep: DuraPrep Patient monitoring: heart rate, cardiac monitor, continuous pulse ox and blood pressure Approach: midline Location: L3-4 Injection technique: single-shot Needle Needle type: Quincke  Needle gauge: 22 G Needle length: 9 cm Assessment Sensory level: T4

## 2018-08-14 NOTE — Op Note (Signed)
OPERATIVE REPORT-TOTAL KNEE ARTHROPLASTY   Pre-operative diagnosis- Osteoarthritis  Right knee(s)  Post-operative diagnosis- Osteoarthritis Right knee(s)  Procedure-  Right  Total Knee Arthroplasty  Surgeon- Gus Rankin. , MD  Assistant- Dimitri Ped, PA-C   Anesthesia-  Adductor canal block and spinal  EBL- 25 ml   Drains Hemovac  Tourniquet time-  Total Tourniquet Time Documented: Thigh (Right) - 45 minutes Total: Thigh (Right) - 45 minutes     Complications- None  Condition-PACU - hemodynamically stable.   Brief Clinical Note  Patricia Thompson is a 53 y.o. year old female with end stage OA of her right knee with progressively worsening pain and dysfunction. She has constant pain, with activity and at rest and significant functional deficits with difficulties even with ADLs. She has had extensive non-op management including analgesics, injections of cortisone and viscosupplements, and home exercise program, but remains in significant pain with significant dysfunction.Radiographs show bone on bone arthritis medial and patellofemoral with severe varus deformity. She presents now for right Total Knee Arthroplasty.    Procedure in detail---   The patient is brought into the operating room and positioned supine on the operating table. After successful administration of  Adductor canal block and spinal,   a tourniquet is placed high on the  Right thigh(s) and the lower extremity is prepped and draped in the usual sterile fashion. Time out is performed by the operating team and then the  Right lower extremity is wrapped in Esmarch, knee flexed and the tourniquet inflated to 300 mmHg.       A midline incision is made with a ten blade through the subcutaneous tissue to the level of the extensor mechanism. A fresh blade is used to make a medial parapatellar arthrotomy. Soft tissue over the proximal medial tibia is subperiosteally elevated to the joint line with a knife and into the  semimembranosus bursa with a Cobb elevator. Soft tissue over the proximal lateral tibia is elevated with attention being paid to avoiding the patellar tendon on the tibial tubercle. The patella is everted, knee flexed 90 degrees and the ACL and PCL are removed. Findings are bone on bone all 3 compartments with massive global osteophytes.        The drill is used to create a starting hole in the distal femur and the canal is thoroughly irrigated with sterile saline to remove the fatty contents. The 5 degree Right  valgus alignment guide is placed into the femoral canal and the distal femoral cutting block is pinned to remove 9 mm off the distal femur. Resection is made with an oscillating saw.      The tibia is subluxed forward and the menisci are removed. The extramedullary alignment guide is placed referencing proximally at the medial aspect of the tibial tubercle and distally along the second metatarsal axis and tibial crest. The block is pinned to remove 15mm off the more deficient medial  side. Resection is made with an oscillating saw. Size 4is the most appropriate size for the tibia and the proximal tibia is prepared with the modular drill and keel punch for that size.      The femoral sizing guide is placed and size 5 is most appropriate. Rotation is marked off the epicondylar axis and confirmed by creating a rectangular flexion gap at 90 degrees. The size 5 cutting block is pinned in this rotation and the anterior, posterior and chamfer cuts are made with the oscillating saw. The intercondylar block is then placed and that  cut is made.      Trial size 4 tibial component, trial size 5 posterior stabilized femur and a 10  mm posterior stabilized rotating platform insert trial is placed. Full extension is achieved with excellent varus/valgus and anterior/posterior balance throughout full range of motion. The patella is everted and thickness measured to be 22  mm. Free hand resection is taken to 12 mm, a 35  template is placed, lug holes are drilled, trial patella is placed, and it tracks normally. Osteophytes are removed off the posterior femur with the trial in place. All trials are removed and the cut bone surfaces prepared with pulsatile lavage. Cement is mixed and once ready for implantation, the size 4 tibial implant, size  5 posterior stabilized femoral component, and the size 35 patella are cemented in place and the patella is held with the clamp. The trial insert is placed and the knee held in full extension. The Exparel (20 ml mixed with 60 ml saline) is injected into the extensor mechanism, posterior capsule, medial and lateral gutters and subcutaneous tissues.  All extruded cement is removed and once the cement is hard the permanent 10 mm posterior stabilized rotating platform insert is placed into the tibial tray.      The wound is copiously irrigated with saline solution and the extensor mechanism closed over a hemovac drain with #1 V-loc suture. The tourniquet is released for a total tourniquet time of 45  minutes. Flexion against gravity is 140 degrees and the patella tracks normally. Subcutaneous tissue is closed with 2.0 vicryl and subcuticular with running 4.0 Monocryl. The incision is cleaned and dried and steri-strips and a bulky sterile dressing are applied. The limb is placed into a knee immobilizer and the patient is awakened and transported to recovery in stable condition.      Please note that a surgical assistant was a medical necessity for this procedure in order to perform it in a safe and expeditious manner. Surgical assistant was necessary to retract the ligaments and vital neurovascular structures to prevent injury to them and also necessary for proper positioning of the limb to allow for anatomic placement of the prosthesis.   Gus Rankin , MD    08/14/2018, 4:25 PM

## 2018-08-14 NOTE — Anesthesia Preprocedure Evaluation (Signed)
Anesthesia Evaluation  Patient identified by MRN, date of birth, ID band Patient awake    Reviewed: Allergy & Precautions, NPO status , Patient's Chart, lab work & pertinent test results  Airway Mallampati: II  TM Distance: >3 FB Neck ROM: Full    Dental no notable dental hx.    Pulmonary Current Smoker,    Pulmonary exam normal breath sounds clear to auscultation       Cardiovascular negative cardio ROS Normal cardiovascular exam Rhythm:Regular Rate:Normal     Neuro/Psych negative neurological ROS  negative psych ROS   GI/Hepatic negative GI ROS, Neg liver ROS,   Endo/Other  negative endocrine ROS  Renal/GU negative Renal ROS  negative genitourinary   Musculoskeletal negative musculoskeletal ROS (+)   Abdominal   Peds negative pediatric ROS (+)  Hematology negative hematology ROS (+)   Anesthesia Other Findings   Reproductive/Obstetrics negative OB ROS                             Anesthesia Physical Anesthesia Plan  ASA: II  Anesthesia Plan: Spinal   Post-op Pain Management:  Regional for Post-op pain   Induction:   PONV Risk Score and Plan: 1 and Propofol infusion and Treatment may vary due to age or medical condition  Airway Management Planned: Simple Face Mask  Additional Equipment:   Intra-op Plan:   Post-operative Plan:   Informed Consent: I have reviewed the patients History and Physical, chart, labs and discussed the procedure including the risks, benefits and alternatives for the proposed anesthesia with the patient or authorized representative who has indicated his/her understanding and acceptance.     Dental advisory given  Plan Discussed with: CRNA  Anesthesia Plan Comments:         Anesthesia Quick Evaluation

## 2018-08-14 NOTE — Interval H&P Note (Signed)
History and Physical Interval Note:  08/14/2018 12:42 PM  Patricia Thompson  has presented today for surgery, with the diagnosis of right knee osteoarthritis.  The various methods of treatment have been discussed with the patient and family. After consideration of risks, benefits and other options for treatment, the patient has consented to  Procedure(s) with comments: TOTAL KNEE ARTHROPLASTY (Right) - as a surgical intervention.  The patient's history has been reviewed, patient examined, no change in status, stable for surgery.  I have reviewed the patient's chart and labs.  Questions were answered to the patient's satisfaction.     Homero Fellers Aluisio

## 2018-08-14 NOTE — Progress Notes (Signed)
AssistedDr. Carignan with right, ultrasound guided, adductor canal block. Side rails up, monitors on throughout procedure. See vital signs in flow sheet. Tolerated Procedure well.  

## 2018-08-14 NOTE — Anesthesia Procedure Notes (Signed)
Anesthesia Regional Block: Adductor canal block   Pre-Anesthetic Checklist: ,, timeout performed, Correct Patient, Correct Site, Correct Laterality, Correct Procedure, Correct Position, site marked, Risks and benefits discussed,  Surgical consent,  Pre-op evaluation,  At surgeon's request and post-op pain management  Laterality: Right and Lower  Prep: Maximum Sterile Barrier Precautions used, chloraprep       Needles:  Injection technique: Single-shot  Needle Type: Echogenic Stimulator Needle     Needle Length: 10cm      Additional Needles:   Procedures:,,,, ultrasound used (permanent image in chart),,,,  Narrative:  Start time: 08/14/2018 2:08 PM End time: 08/14/2018 2:13 PM Injection made incrementally with aspirations every 5 mL.  Performed by: Personally  Anesthesiologist: Phillips Grout, MD  Additional Notes: Risks, benefits and alternative to block explained extensively.  Patient tolerated procedure well, without complications.

## 2018-08-14 NOTE — Transfer of Care (Signed)
Immediate Anesthesia Transfer of Care Note  Patient: Patricia Thompson  Procedure(s) Performed: Procedure(s) with comments: TOTAL KNEE ARTHROPLASTY (Right) -  Patient Location: PACU  Anesthesia Type:Spinal  Level of Consciousness:  sedated, patient cooperative and responds to stimulation  Airway & Oxygen Therapy:Patient Spontanous Breathing and Patient connected to face mask oxgen  Post-op Assessment:  Report given to PACU RN and Post -op Vital signs reviewed and stable  Post vital signs:  Reviewed and stable  Last Vitals:  Vitals:   08/14/18 1428 08/14/18 1429  BP:    Pulse: 65 65  Resp: 19 17  Temp:    SpO2: 100% 100%    Complications: No apparent anesthesia complications

## 2018-08-15 DIAGNOSIS — F17210 Nicotine dependence, cigarettes, uncomplicated: Secondary | ICD-10-CM | POA: Diagnosis present

## 2018-08-15 DIAGNOSIS — Z79899 Other long term (current) drug therapy: Secondary | ICD-10-CM | POA: Diagnosis not present

## 2018-08-15 DIAGNOSIS — Z885 Allergy status to narcotic agent status: Secondary | ICD-10-CM | POA: Diagnosis not present

## 2018-08-15 DIAGNOSIS — M1711 Unilateral primary osteoarthritis, right knee: Secondary | ICD-10-CM | POA: Diagnosis present

## 2018-08-15 DIAGNOSIS — K219 Gastro-esophageal reflux disease without esophagitis: Secondary | ICD-10-CM | POA: Diagnosis present

## 2018-08-15 DIAGNOSIS — E669 Obesity, unspecified: Secondary | ICD-10-CM | POA: Diagnosis present

## 2018-08-15 DIAGNOSIS — Z791 Long term (current) use of non-steroidal anti-inflammatories (NSAID): Secondary | ICD-10-CM | POA: Diagnosis not present

## 2018-08-15 DIAGNOSIS — Z6839 Body mass index (BMI) 39.0-39.9, adult: Secondary | ICD-10-CM | POA: Diagnosis not present

## 2018-08-15 LAB — CBC
HCT: 35.1 % — ABNORMAL LOW (ref 36.0–46.0)
Hemoglobin: 10.6 g/dL — ABNORMAL LOW (ref 12.0–15.0)
MCH: 27.2 pg (ref 26.0–34.0)
MCHC: 30.2 g/dL (ref 30.0–36.0)
MCV: 90 fL (ref 80.0–100.0)
Platelets: 261 10*3/uL (ref 150–400)
RBC: 3.9 MIL/uL (ref 3.87–5.11)
RDW: 14.2 % (ref 11.5–15.5)
WBC: 9.5 10*3/uL (ref 4.0–10.5)
nRBC: 0 % (ref 0.0–0.2)

## 2018-08-15 LAB — BASIC METABOLIC PANEL
Anion gap: 10 (ref 5–15)
BUN: 13 mg/dL (ref 6–20)
CO2: 23 mmol/L (ref 22–32)
Calcium: 8.5 mg/dL — ABNORMAL LOW (ref 8.9–10.3)
Chloride: 106 mmol/L (ref 98–111)
Creatinine, Ser: 0.77 mg/dL (ref 0.44–1.00)
GFR calc Af Amer: >60 mL/min (ref >60)
GFR calc non Af Amer: >60 mL/min (ref >60)
Glucose, Bld: 159 mg/dL — ABNORMAL HIGH (ref 70–99)
Potassium: 4.1 mmol/L (ref 3.5–5.1)
Sodium: 139 mmol/L (ref 135–145)

## 2018-08-15 NOTE — Plan of Care (Signed)
°  Problem: Education: °Goal: Knowledge of the prescribed therapeutic regimen will improve °Outcome: Progressing °  °Problem: Clinical Measurements: °Goal: Postoperative complications will be avoided or minimized °Outcome: Progressing °  °Problem: Pain Management: °Goal: Pain level will decrease with appropriate interventions °Outcome: Progressing °  °

## 2018-08-15 NOTE — Progress Notes (Addendum)
Physical Therapy Treatment Patient Details Name: Patricia Thompson MRN: [id number] DOB: November 28, 1965 Today's Date: 08/15/2018    History of Present Illness R TKA    PT Comments    The patient  Is progressing very well. Patient reports  That she  does not know time for OPPT appointment Friday.   Follow Up Recommendations  Outpatient PT;Follow surgeon's recommendation for DC plan and follow-up therapies     Equipment Recommendations  Rolling walker with 5" wheels    Recommendations for Other Services       Precautions / Restrictions Precautions Precautions: Knee Precaution Comments: did not use KI Required Braces or Orthoses: Knee Immobilizer - Right Knee Immobilizer - Right: Discontinue once straight leg raise with < 10 degree lag    Mobility  Bed Mobility Overal bed mobility: Needs Assistance Bed Mobility: Sit to Supine     Supine to sit: Supervision Sit to supine: Supervision   General bed mobility comments: self supports right leg  Transfers Overall transfer level: Needs assistance Equipment used: Rolling walker (2 wheeled) Transfers: Sit to/from Stand Sit to Stand: Min guard         General transfer comment: cues for hand and right leg position  Ambulation/Gait Ambulation/Gait assistance: Min guard Gait Distance (Feet): 400 Feet Assistive device: Rolling walker (2 wheeled) Gait Pattern/deviations: Step-through pattern Gait velocity: decr   General Gait Details: cues for sequence and  RW safety   Stairs             Wheelchair Mobility    Modified Rankin (Stroke Patients Only)       Balance                                            Cognition Arousal/Alertness: Awake/alert Behavior During Therapy: WFL for tasks assessed/performed Overall Cognitive Status: Within Functional Limits for tasks assessed                                        Exercises Performed  Knee extension and SLR x 10 reps.    General Comments        Pertinent Vitals/Pain Pain Assessment: 0-10 Pain Score: 4  Pain Location: right knee Pain Descriptors / Indicators: Discomfort;Sore Pain Intervention(s): Premedicated before session;Monitored during session;Ice applied    Home Living Family/patient expects to be discharged to:: Private residence Living Arrangements: Children;Other relatives Available Help at Discharge: Family Type of Home: House Home Access: Ramped entrance   Home Layout: One level Home Equipment: Walker - standard;Bedside commode;Wheelchair - manual;Hospital bed      Prior Function Level of Independence: Independent          PT Goals (current goals can now be found in the care plan section) Acute Rehab PT Goals Patient Stated Goal: to go home and take care of my brother PT Goal Formulation: With patient Time For Goal Achievement: 08/22/18 Potential to Achieve Goals: Good Progress towards PT goals: Progressing toward goals    Frequency    7X/week      PT Plan Current plan remains appropriate    Co-evaluation              AM-PAC PT "6 Clicks" Mobility   Outcome Measure  Help needed turning from your back to your side while in a flat  bed without using bedrails?: None Help needed moving from lying on your back to sitting on the side of a flat bed without using bedrails?: None Help needed moving to and from a bed to a chair (including a wheelchair)?: A Little Help needed standing up from a chair using your arms (e.g., wheelchair or bedside chair)?: A Little Help needed to walk in hospital room?: A Little Help needed climbing 3-5 steps with a railing? : A Lot 6 Click Score: 19    End of Session   Activity Tolerance: Patient tolerated treatment well Patient left: in bed;with call bell/phone within reach Nurse Communication: Mobility status PT Visit Diagnosis: Unsteadiness on feet (R26.81)     Time: 1638-4665 PT Time Calculation (min) (ACUTE ONLY): 32  min  Charges:  $Gait Training: 23-37 mins                     Blanchard Kelch PT Acute Rehabilitation Services Pager [phone] Office [phone]    Rada Hay 08/15/2018, 2:18 PM

## 2018-08-15 NOTE — Progress Notes (Signed)
   Subjective: 1 Day Post-Op Procedure(s) (LRB): TOTAL KNEE ARTHROPLASTY (Right) Patient reports pain as mild.   Patient seen in rounds by Dr. Lequita Halt. Patient is well, and has had no acute complaints or problems other than pain in the right knee. Denies chest pain, SOB, or calf pain. Foley catheter removed this AM. No issues overnight.  We will start therapy today.   Objective: Vital signs in last 24 hours: Temp:  [97.5 F (36.4 C)-98.8 F (37.1 C)] 97.7 F (36.5 C) (03/10 0434) Pulse Rate:  [48-84] 56 (03/10 0434) Resp:  [10-21] 16 (03/10 0434) BP: (100-162)/(51-102) 113/55 (03/10 0434) SpO2:  [82 %-100 %] 100 % (03/10 0434) Weight:  [91.3 kg] 91.3 kg (03/09 1244)  Intake/Output from previous day:  Intake/Output Summary (Last 24 hours) at 08/15/2018 0756 Last data filed at 08/15/2018 0600 Gross per 24 hour  Intake 4016.25 ml  Output 1755 ml  Net 2261.25 ml    Labs: Recent Labs    08/15/18 0339  HGB 10.6*   Recent Labs    08/15/18 0339  WBC 9.5  RBC 3.90  HCT 35.1*  PLT 261   Recent Labs    08/15/18 0339  NA 139  K 4.1  CL 106  CO2 23  BUN 13  CREATININE 0.77  GLUCOSE 159*  CALCIUM 8.5*   Exam: General - Patient is Alert and Oriented Extremity - Neurologically intact Neurovascular intact Sensation intact distally Dorsiflexion/Plantar flexion intact Dressing - dressing C/D/I Motor Function - intact, moving foot and toes well on exam.   Past Medical History:  Diagnosis Date  . GERD (gastroesophageal reflux disease)    occasional  . OA (osteoarthritis)    knees    Assessment/Plan: 1 Day Post-Op Procedure(s) (LRB): TOTAL KNEE ARTHROPLASTY (Right) Principal Problem:   OA (osteoarthritis) of knee Active Problems:   Osteoarthritis of right knee  Estimated body mass index is 39.3 kg/m as calculated from the following:   Height as of this encounter: 5' (1.524 m).   Weight as of this encounter: 91.3 kg. Advance diet Up with  therapy  Anticipated LOS equal to or greater than 2 midnights due to - Age 53 and older with one or more of the following:  - Obesity  - Expected need for hospital services (PT, OT, Nursing) required for safe  discharge  - Anticipated need for postoperative skilled nursing care or inpatient rehab  - Active co-morbidities: None OR   - Unanticipated findings during/Post Surgery: None  - Patient is a high risk of re-admission due to: None    DVT Prophylaxis - Aspirin Weight bearing as tolerated. D/C O2 and pulse ox and try on room air. Hemovac pulled without difficulty, will begin therapy today.  Plan is to go Home after hospital stay. Plan for discharge tomorrow pending progress with therapy.  Arther Abbott, PA-C Orthopedic Surgery 08/15/2018, 7:56 AM

## 2018-08-15 NOTE — Evaluation (Signed)
Physical Therapy Evaluation Patient Details Name: Patricia Thompson MRN: [id number] DOB: 1966/02/14 Today's Date: 08/15/2018   History of Present Illness  R TKA  Clinical Impression  The patient ambulated x 200'. Patient plans to DC home with OPPT. Pt admitted with above diagnosis. Pt currently with functional limitations due to the deficits listed below (see PT Problem List).  Pt will benefit from skilled PT to increase their independence and safety with mobility to allow discharge to the venue listed below.       Follow Up Recommendations Outpatient PT;Follow surgeon's recommendation for DC plan and follow-up therapies    Equipment Recommendations  Rolling walker with 5" wheels    Recommendations for Other Services       Precautions / Restrictions Precautions Precautions: Knee Precaution Comments: did not use KI Required Braces or Orthoses: Knee Immobilizer - Right Knee Immobilizer - Right: Discontinue once straight leg raise with < 10 degree lag      Mobility  Bed Mobility Overal bed mobility: Needs Assistance Bed Mobility: Supine to Sit     Supine to sit: Supervision     General bed mobility comments: self supports right knee  Transfers Overall transfer level: Needs assistance Equipment used: Rolling walker (2 wheeled) Transfers: Sit to/from Stand Sit to Stand: Min guard         General transfer comment: cues for hand and right leg position  Ambulation/Gait Ambulation/Gait assistance: Min guard Gait Distance (Feet): 200 Feet Assistive device: Rolling walker (2 wheeled) Gait Pattern/deviations: Step-through pattern;Step-to pattern Gait velocity: decr   General Gait Details: cues for sequence and  RW safety  Stairs            Wheelchair Mobility    Modified Rankin (Stroke Patients Only)       Balance                                             Pertinent Vitals/Pain Pain Assessment: 0-10 Pain Score: 4  Pain Location:  right knee Pain Descriptors / Indicators: Aching;Discomfort;Grimacing;Sore Pain Intervention(s): Limited activity within patient's tolerance;Monitored during session;Premedicated before session;Repositioned;Ice applied    Home Living Family/patient expects to be discharged to:: Private residence Living Arrangements: Children;Other relatives Available Help at Discharge: Family Type of Home: House Home Access: Ramped entrance     Home Layout: One level Home Equipment: Walker - standard;Bedside commode;Wheelchair - manual;Hospital bed      Prior Function Level of Independence: Independent               Hand Dominance        Extremity/Trunk Assessment   Upper Extremity Assessment Upper Extremity Assessment: Overall WFL for tasks assessed    Lower Extremity Assessment Lower Extremity Assessment: RLE deficits/detail RLE Deficits / Details: +mSLR, knee flexion 10-50       Communication   Communication: No difficulties  Cognition Arousal/Alertness: Awake/alert Behavior During Therapy: WFL for tasks assessed/performed Overall Cognitive Status: Within Functional Limits for tasks assessed                                        General Comments      Exercises Total Joint Exercises Ankle Circles/Pumps: 10 reps Quad Sets: AROM;Both Towel Squeeze: 10 reps Heel Slides: 10 reps Hip ABduction/ADduction: 10 reps Straight Leg  Raises: 10 reps   Assessment/Plan    PT Assessment Patient needs continued PT services  PT Problem List Decreased strength;Decreased range of motion;Decreased activity tolerance;Decreased balance;Decreased mobility;Decreased knowledge of precautions;Decreased safety awareness;Pain       PT Treatment Interventions DME instruction;Therapeutic exercise;Gait training;Functional mobility training;Therapeutic activities;Patient/family education    PT Goals (Current goals can be found in the Care Plan section)  Acute Rehab PT  Goals Patient Stated Goal: to go home and take care of my brother PT Goal Formulation: With patient Time For Goal Achievement: 08/22/18 Potential to Achieve Goals: Good    Frequency 7X/week   Barriers to discharge        Co-evaluation               AM-PAC PT "6 Clicks" Mobility  Outcome Measure Help needed turning from your back to your side while in a flat bed without using bedrails?: A Little Help needed moving from lying on your back to sitting on the side of a flat bed without using bedrails?: A Little Help needed moving to and from a bed to a chair (including a wheelchair)?: A Little Help needed standing up from a chair using your arms (e.g., wheelchair or bedside chair)?: A Little Help needed to walk in hospital room?: A Little Help needed climbing 3-5 steps with a railing? : A Lot 6 Click Score: 17    End of Session   Activity Tolerance: Patient tolerated treatment well Patient left: in chair;with call bell/phone within reach;with chair alarm set Nurse Communication: Mobility status PT Visit Diagnosis: Unsteadiness on feet (R26.81)    Time: 9741-6384 PT Time Calculation (min) (ACUTE ONLY): 36 min   Charges:   PT Evaluation $PT Eval Low Complexity: 1 Low PT Treatments $Gait Training: 8-22 mins        Blanchard Kelch PT Acute Rehabilitation Services Pager [phone] Office [phone]   Rada Hay 08/15/2018, 12:32 PM

## 2018-08-16 ENCOUNTER — Encounter (HOSPITAL_COMMUNITY): Payer: Self-pay | Admitting: Orthopedic Surgery

## 2018-08-16 LAB — BASIC METABOLIC PANEL
Anion gap: 9 (ref 5–15)
BUN: 14 mg/dL (ref 6–20)
CO2: 27 mmol/L (ref 22–32)
Calcium: 8.9 mg/dL (ref 8.9–10.3)
Chloride: 103 mmol/L (ref 98–111)
Creatinine, Ser: 0.63 mg/dL (ref 0.44–1.00)
GFR calc Af Amer: >60 mL/min (ref >60)
GFR calc non Af Amer: >60 mL/min (ref >60)
Glucose, Bld: 131 mg/dL — ABNORMAL HIGH (ref 70–99)
Potassium: 3.9 mmol/L (ref 3.5–5.1)
Sodium: 139 mmol/L (ref 135–145)

## 2018-08-16 LAB — CBC
HCT: 32.8 % — ABNORMAL LOW (ref 36.0–46.0)
Hemoglobin: 10.2 g/dL — ABNORMAL LOW (ref 12.0–15.0)
MCH: 27.6 pg (ref 26.0–34.0)
MCHC: 31.1 g/dL (ref 30.0–36.0)
MCV: 88.9 fL (ref 80.0–100.0)
Platelets: 248 10*3/uL (ref 150–400)
RBC: 3.69 MIL/uL — ABNORMAL LOW (ref 3.87–5.11)
RDW: 14.6 % (ref 11.5–15.5)
WBC: 16.5 10*3/uL — ABNORMAL HIGH (ref 4.0–10.5)
nRBC: 0 % (ref 0.0–0.2)

## 2018-08-16 MED ORDER — METHOCARBAMOL 500 MG PO TABS: 500.0000 mg | ORAL_TABLET | Freq: Four times a day (QID) | ORAL | 0 refills | Status: DC | PRN

## 2018-08-16 MED ORDER — OXYCODONE HCL 5 MG PO TABS: 5.0000 mg | ORAL_TABLET | Freq: Four times a day (QID) | ORAL | 0 refills | Status: DC | PRN

## 2018-08-16 MED ORDER — GABAPENTIN 300 MG PO CAPS: 300.0000 mg | ORAL_CAPSULE | Freq: Three times a day (TID) | ORAL | 0 refills | Status: DC

## 2018-08-16 MED ORDER — TRAMADOL HCL 50 MG PO TABS: 50.0000 mg | ORAL_TABLET | Freq: Four times a day (QID) | ORAL | 0 refills | Status: DC | PRN

## 2018-08-16 MED ORDER — ASPIRIN 325 MG PO TBEC: 325.0000 mg | DELAYED_RELEASE_TABLET | Freq: Two times a day (BID) | ORAL | 0 refills | Status: AC

## 2018-08-16 NOTE — Progress Notes (Signed)
Physical Therapy Treatment Patient Details Name: Patricia Thompson MRN: [id number] DOB: 12-28-1965 Today's Date: 08/16/2018    History of Present Illness R TKA    PT Comments    Pt motivated and progressing with mobility but struggling with ROM.  Pt ambulated in hall and reviewed home therex program with written instruction provided.   Follow Up Recommendations  Outpatient PT;Follow surgeon's recommendation for DC plan and follow-up therapies     Equipment Recommendations  Rolling walker with 5" wheels    Recommendations for Other Services       Precautions / Restrictions Precautions Precautions: Knee Precaution Comments: did not use KI Required Braces or Orthoses: Knee Immobilizer - Right Knee Immobilizer - Right: Discontinue once straight leg raise with < 10 degree lag Restrictions Weight Bearing Restrictions: No    Mobility  Bed Mobility Overal bed mobility: Needs Assistance Bed Mobility: Supine to Sit     Supine to sit: Supervision     General bed mobility comments: self supports right leg  Transfers Overall transfer level: Needs assistance Equipment used: Rolling walker (2 wheeled) Transfers: Sit to/from Stand Sit to Stand: Supervision         General transfer comment: cues for hand and right leg position  Ambulation/Gait Ambulation/Gait assistance: Min guard;Supervision Gait Distance (Feet): 100 Feet Assistive device: Rolling walker (2 wheeled) Gait Pattern/deviations: Step-through pattern Gait velocity: decr   General Gait Details: cues for sequence and  RW safety   Stairs             Wheelchair Mobility    Modified Rankin (Stroke Patients Only)       Balance Overall balance assessment: Mild deficits observed, not formally tested                                          Cognition Arousal/Alertness: Awake/alert Behavior During Therapy: WFL for tasks assessed/performed Overall Cognitive Status: Within Functional  Limits for tasks assessed                                        Exercises Total Joint Exercises Ankle Circles/Pumps: AROM;Both;20 reps;Supine Quad Sets: AROM;Both;10 reps;Supine Heel Slides: AAROM;Right;20 reps;Supine Straight Leg Raises: AAROM;AROM;Right;20 reps;Supine Long Arc Quad: AAROM;AROM;Right;10 reps;Seated Goniometric ROM: AAROM R knee -10 - 40 with muscle guarding    General Comments        Pertinent Vitals/Pain Pain Assessment: 0-10 Pain Score: 6  Pain Location: right knee Pain Descriptors / Indicators: Discomfort;Sore Pain Intervention(s): Limited activity within patient's tolerance;Monitored during session;Premedicated before session    Home Living                      Prior Function            PT Goals (current goals can now be found in the care plan section) Acute Rehab PT Goals Patient Stated Goal: to go home and take care of my brother PT Goal Formulation: With patient Time For Goal Achievement: 08/22/18 Potential to Achieve Goals: Good Progress towards PT goals: Progressing toward goals    Frequency    7X/week      PT Plan Current plan remains appropriate    Co-evaluation              AM-PAC PT "6 Clicks" Mobility  Outcome Measure  Help needed turning from your back to your side while in a flat bed without using bedrails?: None Help needed moving from lying on your back to sitting on the side of a flat bed without using bedrails?: None Help needed moving to and from a bed to a chair (including a wheelchair)?: A Little Help needed standing up from a chair using your arms (e.g., wheelchair or bedside chair)?: A Little Help needed to walk in hospital room?: A Little Help needed climbing 3-5 steps with a railing? : A Little 6 Click Score: 20    End of Session Equipment Utilized During Treatment: Gait belt Activity Tolerance: Patient tolerated treatment well Patient left: in chair;with call bell/phone  within reach Nurse Communication: Mobility status PT Visit Diagnosis: Unsteadiness on feet (R26.81)     Time: 0930-1002 PT Time Calculation (min) (ACUTE ONLY): 32 min  Charges:  $Gait Training: 8-22 mins $Therapeutic Exercise: 8-22 mins                     Mauro Kaufmann PT Acute Rehabilitation Services Pager [phone] Office [phone]    Patricia Thompson,Patricia Thompson 08/16/2018, 12:23 PM

## 2018-08-16 NOTE — Plan of Care (Signed)
  Problem: Education: Goal: Knowledge of the prescribed therapeutic regimen will improve Outcome: Progressing   Problem: Activity: Goal: Ability to avoid complications of mobility impairment will improve Outcome: Progressing   Problem: Clinical Measurements: Goal: Will remain free from infection Outcome: Progressing   Problem: Elimination: Goal: Will not experience complications related to urinary retention Outcome: Progressing   Problem: Pain Managment: Goal: General experience of comfort will improve Outcome: Progressing   Problem: Safety: Goal: Ability to remain free from injury will improve Outcome: Progressing

## 2018-08-16 NOTE — Progress Notes (Signed)
   Subjective: 2 Days Post-Op Procedure(s) (LRB): TOTAL KNEE ARTHROPLASTY (Right) Patient reports pain as mild.   Patient seen in rounds with Dr. Lequita Halt. Patient is well, and has had no acute complaints or problems other than pain in the right knee. Denies chest pain or SOB. Voiding without difficulty and positive flatus. No issues overnight.  Plan is to go Home after hospital stay.  Objective: Vital signs in last 24 hours: Temp:  [97.5 F (36.4 C)-98.2 F (36.8 C)] 97.6 F (36.4 C) (03/11 0434) Pulse Rate:  [54-67] 67 (03/11 0434) Resp:  [16] 16 (03/10 2040) BP: (105-146)/(60-95) 117/75 (03/11 0434)  Intake/Output from previous day:  Intake/Output Summary (Last 24 hours) at 08/16/2018 0824 Last data filed at 08/16/2018 0600 Gross per 24 hour  Intake 520 ml  Output 450 ml  Net 70 ml    Labs: Recent Labs    08/15/18 0339 08/16/18 0547  HGB 10.6* 10.2*   Recent Labs    08/15/18 0339 08/16/18 0547  WBC 9.5 16.5*  RBC 3.90 3.69*  HCT 35.1* 32.8*  PLT 261 248   Recent Labs    08/15/18 0339 08/16/18 0547  NA 139 139  K 4.1 3.9  CL 106 103  CO2 23 27  BUN 13 14  CREATININE 0.77 0.63  GLUCOSE 159* 131*  CALCIUM 8.5* 8.9   Exam: General - Patient is Alert and Oriented Extremity - Neurologically intact Neurovascular intact Sensation intact distally Dorsiflexion/Plantar flexion intact Dressing/Incision - clean, dry, no drainage Motor Function - intact, moving foot and toes well on exam. Negative Homan sign.  Past Medical History:  Diagnosis Date  . GERD (gastroesophageal reflux disease)    occasional  . OA (osteoarthritis)    knees    Assessment/Plan: 2 Days Post-Op Procedure(s) (LRB): TOTAL KNEE ARTHROPLASTY (Right) Principal Problem:   OA (osteoarthritis) of knee Active Problems:   Osteoarthritis of right knee  Estimated body mass index is 39.3 kg/m as calculated from the following:   Height as of this encounter: 5' (1.524 m).   Weight as of  this encounter: 91.3 kg. Up with therapy D/C IV fluids  DVT Prophylaxis - Aspirin Weight-bearing as tolerated  Plan for discharge to home after one session of therapy. Scheduled for outpatient PT at [facility]. Follow-up in the office in 2 weeks.   Arther Abbott, PA-C Orthopedic Surgery 08/16/2018, 8:24 AM

## 2018-08-21 ENCOUNTER — Ambulatory Visit: Payer: BLUE CROSS/BLUE SHIELD | Attending: Orthopedic Surgery | Admitting: Physical Therapy

## 2018-08-21 ENCOUNTER — Other Ambulatory Visit: Payer: Self-pay

## 2018-08-21 ENCOUNTER — Encounter: Payer: Self-pay | Admitting: Physical Therapy

## 2018-08-21 DIAGNOSIS — M25661 Stiffness of right knee, not elsewhere classified: Secondary | ICD-10-CM | POA: Diagnosis present

## 2018-08-21 DIAGNOSIS — M25561 Pain in right knee: Secondary | ICD-10-CM | POA: Diagnosis present

## 2018-08-21 DIAGNOSIS — R262 Difficulty in walking, not elsewhere classified: Secondary | ICD-10-CM | POA: Insufficient documentation

## 2018-08-21 NOTE — Therapy (Signed)
[facility] [address] [place], [place], [address] Phone: [phone]   Fax:  [phone]  Physical Therapy Evaluation  Patient Details  Name: Patricia Thompson MRN: [id number] Date of Birth: June 22, 1965 Referring Provider (PT): Ollen Gross, MD   Encounter Date: 08/21/2018  PT End of Session - 08/21/18 2103    Visit Number  1    Number of Visits  12    Date for PT Re-Evaluation  09/25/18    Authorization Type  FOTO;Progress note every 10th visit    PT Start Time  1350    PT Stop Time  1433    PT Time Calculation (min)  43 min    Activity Tolerance  Patient tolerated treatment well    Behavior During Therapy  [facility] for tasks assessed/performed       Past Medical History:  Diagnosis Date  . GERD (gastroesophageal reflux disease)    occasional  . OA (osteoarthritis)    knees    Past Surgical History:  Procedure Laterality Date  . COLONOSCOPY  06/2018  . DILATION AND CURETTAGE OF UTERUS  age 33  . TONSILLECTOMY  age 25  . 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   Subjective Assessment - 08/21/18 2059    Subjective  Patient arrives to physical therapy with reports of right knee pain, stiffness, and difficulty walking due to a right total knee replacement on 08/14/2018. Patient states she did not receive her post-operative home exercise program and therefore has not been performing any exercises. Patient reports she can ambulate within home with her walker. She reports she can perform basic ADLs independently and has her son and siblings to assist if necessary. Patient reports pain at worst is 7/10 and describes her pain as tight and sharp. Patient reports pain at best as 5/10 with pain medication. Patient's goals are to decrease pain, improve walking, sleep  longer than 4 hours, and improve ability to perform home activities.    Pertinent History  right TKA 08/14/2018    Limitations  Standing;Walking;Sitting;House hold activities    Diagnostic tests  x-ray    Patient Stated Goals  walk better, move better    Currently in Pain?  Yes    Pain Score  6     Pain Location  Knee    Pain Orientation  Right    Pain Descriptors / Indicators  Tightness;Sharp    Pain Type  Surgical pain    Pain Onset  1 to 4 weeks ago    Pain Frequency  Constant    Aggravating Factors   "movement, standing too long"    Pain Relieving Factors  "meds"    Effect of Pain on Daily Activities  "just have to be careful"         [facility] PT Assessment - 08/21/18 0001      Assessment   Medical Diagnosis  unilateral primary osteoarthritis, right knee    Referring Provider (PT)  Ollen Gross, MD    Onset Date/Surgical Date  08/14/18    Next MD Visit  08/29/2018    Prior Therapy  no      Precautions   Precautions  Other (comment)    Precaution Comments  No Ultrasound      Balance Screen   Has the patient fallen in  the past 6 months  No    Has the patient had a decrease in activity level because of a fear of falling?   Yes    Is the patient reluctant to leave their home because of a fear of falling?   No      Home Public house manager residence    Living Arrangements  Children;Other relatives   siblings   Available Help at Discharge  Family    Home Access  Ramped entrance    Home Layout  One level    Home Equipment  Walker - 2 wheels;Bedside commode      Prior Function   Level of Independence  Needs assistance with ADLs;Needs assistance with homemaking;Independent with household mobility with device      Observation/Other Assessments   Focus on Therapeutic Outcomes (FOTO)   70% limited      Observation/Other Assessments-Edema    Edema  Circumferential      Circumferential Edema   Circumferential - Right  52.5 cm at mid patella     Circumferential - Left   38.0 cm at mid patella      ROM / Strength   AROM / PROM / Strength  AROM;PROM      AROM   AROM Assessment Site  Knee    Right/Left Knee  Right    Right Knee Extension  11    Right Knee Flexion  60      PROM   PROM Assessment Site  Knee    Right/Left Knee  Right    Right Knee Extension  10    Right Knee Flexion  70      Palpation   Palpation comment  Tender to palpation to medial and lateral aspect of right knee      Transfers   Comments  Supervision assist for transfers      Ambulation/Gait   Assistive device  Rolling walker    Gait Pattern  Step-to pattern;Decreased stance time - right;Decreased stride length;Decreased step length - left;Decreased hip/knee flexion - right;Decreased weight shift to right;Right flexed knee in stance                Objective measurements completed on examination: See above findings.              PT Education - 08/21/18 2101    Education Details  heel slides sitting and supine, quad sets,  ankle pumps, elevation and icing to reduce edema    Person(s) Educated  Patient    Methods  Explanation;Handout    Comprehension  Verbalized understanding;Returned demonstration       PT Short Term Goals - 08/21/18 2108      PT SHORT TERM GOAL #1   Title  Patient will be independent with initial HEP    Time  2    Period  Weeks    Status  New      PT SHORT TERM GOAL #2   Title  Patient will demonstrate 90 degrees of right knee flexion PROM to improve ROM.    Time  2    Period  Weeks    Status  New      PT SHORT TERM GOAL #3   Title  Patient will demonstrate 5 degrees or less of right knee extension ROM to improve ROM    Time  2    Period  Weeks    Status  New        PT Long  Term Goals - 08/21/18 2110      PT LONG TERM GOAL #1   Title  Patient will be independent with advanced HEP    Time  4    Period  Weeks    Status  New      PT LONG TERM GOAL #2   Title  Patient will demonstrate 120+  degrees of right knee flexion AROM to improve ability to perform functional tasks.    Time  4    Period  Weeks    Status  New      PT LONG TERM GOAL #3   Title  Patient will demonstrate 3 degrees or less of right knee extension AROM to improve gait mechanics.    Time  4    Period  Weeks    Status  New      PT LONG TERM GOAL #4   Title  Patient wil demonstrate 4/5 or greater of right knee MMT in all planes to improve stability during functional tasks.    Time  4    Period  Weeks    Status  New      PT LONG TERM GOAL #5   Title  Patient will report ability to perform ADLs with right knee pain less than or equal to 3/10.    Time  4    Period  Weeks    Status  New             Plan - 08/21/18 2104    Clinical Impression Statement  Patient is a 53 year old female who presents to physical therapy with decreased right knee ROM, increased right knee edema, and abnormal gait pattern secondary to a right total knee replacement on 08/14/2018. Patient requires supervision for transfers and required verbal cuing to push up from sitting surface than to pull on walker. Patient's incisions are covered with gauze and medical tape. Patient noted with right hip external rotation and provided with verbal and tactile cues to prevent rotation. Patient ambulates with a rolling walker with a step to gait pattern, decreased right stance time, decreased left step length and decreased knee flexion during swing. Patient and PT discussed and reviewed HEP to which patient reported understanding. Patient would benefit from skilled physical therapy to address deficits and address patient's goals.     Examination-Activity Limitations  Bathing;Stand;Dressing;Stairs;Sleep    Stability/Clinical Decision Making  Stable/Uncomplicated    Clinical Decision Making  Low    Rehab Potential  Good    PT Frequency  3x / week    PT Duration  4 weeks    PT Treatment/Interventions  ADLs/Self Care Home Management;Electrical  Stimulation;Cryotherapy;Moist Heat;Gait training;Stair training;Functional mobility training;Manual techniques;Neuromuscular re-education;Balance training;Therapeutic exercise;Therapeutic activities;Patient/family education;Taping;Vasopneumatic Device;Passive range of motion    PT Next Visit Plan  Nustep, knee PROM and AROM, Modalities PRN for pain relief    PT Home Exercise Plan  see patient education section    Consulted and Agree with Plan of Care  Patient       Patient will benefit from skilled therapeutic intervention in order to improve the following deficits and impairments:  Pain, Decreased activity tolerance, Decreased range of motion, Decreased strength, Difficulty walking, Increased edema  Visit Diagnosis: Acute pain of right knee  Stiffness of right knee, not elsewhere classified  Difficulty in walking, not elsewhere classified     Problem List Patient Active Problem List   Diagnosis Date Noted  . OA (osteoarthritis) of knee 08/14/2018  . Osteoarthritis of  right knee 08/14/2018   Guss Bunde, PT, DPT 08/21/2018, 9:13 PM  [facility] [address] [place], [place], [address] Phone: [phone]   Fax:  [phone]  Name: NALEYAH BEZIO MRN: [id number] Date of Birth: 1965/11/04

## 2018-08-23 ENCOUNTER — Ambulatory Visit: Payer: BLUE CROSS/BLUE SHIELD | Admitting: Physical Therapy

## 2018-08-23 ENCOUNTER — Other Ambulatory Visit: Payer: Self-pay

## 2018-08-23 ENCOUNTER — Encounter: Payer: Self-pay | Admitting: Physical Therapy

## 2018-08-23 DIAGNOSIS — R262 Difficulty in walking, not elsewhere classified: Secondary | ICD-10-CM

## 2018-08-23 DIAGNOSIS — M25561 Pain in right knee: Secondary | ICD-10-CM

## 2018-08-23 DIAGNOSIS — M25661 Stiffness of right knee, not elsewhere classified: Secondary | ICD-10-CM

## 2018-08-23 NOTE — Discharge Summary (Signed)
Physician Discharge Summary   Patient ID: Patricia Thompson MRN: [id number] DOB/AGE: [birth year] 53 y.o.  Admit date: 08/14/2018 Discharge date: 08/16/2018  Primary Diagnosis: Osteoarthritis, right kne   Admission Diagnoses:  Past Medical History:  Diagnosis Date  . GERD (gastroesophageal reflux disease)    occasional  . OA (osteoarthritis)    knees   Discharge Diagnoses:   Principal Problem:   OA (osteoarthritis) of knee Active Problems:   Osteoarthritis of right knee  Estimated body mass index is 39.3 kg/m as calculated from the following:   Height as of this encounter: 5' (1.524 m).   Weight as of this encounter: 91.3 kg.  Procedure:  Procedure(s) (LRB): TOTAL KNEE ARTHROPLASTY (Right)   Consults: None  HPI: Patricia Thompson is a 53 y.o. year old female with end stage OA of her right knee with progressively worsening pain and dysfunction. She has constant pain, with activity and at rest and significant functional deficits with difficulties even with ADLs. She has had extensive non-op management including analgesics, injections of cortisone and viscosupplements, and home exercise program, but remains in significant pain with significant dysfunction.Radiographs show bone on bone arthritis medial and patellofemoral with severe varus deformity. She presents now for right Total Knee Arthroplasty.    Laboratory Data: Admission on 08/14/2018, Discharged on 08/16/2018  Component Date Value Ref Range Status  . WBC 08/15/2018 9.5  4.0 - 10.5 K/uL Final  . RBC 08/15/2018 3.90  3.87 - 5.11 MIL/uL Final  . Hemoglobin 08/15/2018 10.6* 12.0 - 15.0 g/dL Final  . HCT 78/29/5621 35.1* 36.0 - 46.0 % Final  . MCV 08/15/2018 90.0  80.0 - 100.0 fL Final  . MCH 08/15/2018 27.2  26.0 - 34.0 pg Final  . MCHC 08/15/2018 30.2  30.0 - 36.0 g/dL Final  . RDW 30/86/5784 14.2  11.5 - 15.5 % Final  . Platelets 08/15/2018 261  150 - 400 K/uL Final  . nRBC 08/15/2018 0.0  0.0 - 0.2 % Final   Performed at  [facility], [address] [place], [place] [address]  . Sodium 08/15/2018 139  135 - 145 mmol/L Final  . Potassium 08/15/2018 4.1  3.5 - 5.1 mmol/L Final  . Chloride 08/15/2018 106  98 - 111 mmol/L Final  . CO2 08/15/2018 23  22 - 32 mmol/L Final  . Glucose, Bld 08/15/2018 159* 70 - 99 mg/dL Final  . BUN 52/84/1324 13  6 - 20 mg/dL Final  . Creatinine, Ser 08/15/2018 0.77  0.44 - 1.00 mg/dL Final  . Calcium 40/03/2724 8.5* 8.9 - 10.3 mg/dL Final  . GFR calc non Af Amer 08/15/2018 >60  >60 mL/min Final  . GFR calc Af Amer 08/15/2018 >60  >60 mL/min Final  . Anion gap 08/15/2018 10  5 - 15 Final   Performed at [facility], [address], [place], [place] [address]  . WBC 08/16/2018 16.5* 4.0 - 10.5 K/uL Final  . RBC 08/16/2018 3.69* 3.87 - 5.11 MIL/uL Final  . Hemoglobin 08/16/2018 10.2* 12.0 - 15.0 g/dL Final  . HCT 03/47/4259 32.8* 36.0 - 46.0 % Final  . MCV 08/16/2018 88.9  80.0 - 100.0 fL Final  . MCH 08/16/2018 27.6  26.0 - 34.0 pg Final  . MCHC 08/16/2018 31.1  30.0 - 36.0 g/dL Final  . RDW 56/38/7564 14.6  11.5 - 15.5 % Final  . Platelets 08/16/2018 248  150 - 400 K/uL Final  . nRBC 08/16/2018 0.0  0.0 - 0.2 %  Final   Performed at [facility], [address] [place], [place] [address]  . Sodium 08/16/2018 139  135 - 145 mmol/L Final  . Potassium 08/16/2018 3.9  3.5 - 5.1 mmol/L Final  . Chloride 08/16/2018 103  98 - 111 mmol/L Final  . CO2 08/16/2018 27  22 - 32 mmol/L Final  . Glucose, Bld 08/16/2018 131* 70 - 99 mg/dL Final  . BUN 60/45/4098 14  6 - 20 mg/dL Final  . Creatinine, Ser 08/16/2018 0.63  0.44 - 1.00 mg/dL Final  . Calcium 11/91/4782 8.9  8.9 - 10.3 mg/dL Final  . GFR calc non Af Amer 08/16/2018 >60  >60 mL/min Final  . GFR calc Af Amer 08/16/2018 >60  >60 mL/min Final  . Anion gap 08/16/2018 9  5 - 15 Final   Performed at [facility], [address] [place], [place] [address]   Hospital Outpatient Visit on 08/07/2018  Component Date Value Ref Range Status  . aPTT 08/07/2018 36  24 - 36 seconds Final   Performed at [facility], [address] [place], [place] [address]  . WBC 08/07/2018 6.5  4.0 - 10.5 K/uL Final  . RBC 08/07/2018 4.57  3.87 - 5.11 MIL/uL Final  . Hemoglobin 08/07/2018 12.4  12.0 - 15.0 g/dL Final  . HCT 78/46/9629 40.0  36.0 - 46.0 % Final  . MCV 08/07/2018 87.5  80.0 - 100.0 fL Final  . MCH 08/07/2018 27.1  26.0 - 34.0 pg Final  . MCHC 08/07/2018 31.0  30.0 - 36.0 g/dL Final  . RDW 52/84/1324 14.5  11.5 - 15.5 % Final  . Platelets 08/07/2018 294  150 - 400 K/uL Final  . nRBC 08/07/2018 0.0  0.0 - 0.2 % Final   Performed at [facility], [address], [place], [place] [address]  . Sodium 08/07/2018 138  135 - 145 mmol/L Final  . Potassium 08/07/2018 4.2  3.5 - 5.1 mmol/L Final  . Chloride 08/07/2018 106  98 - 111 mmol/L Final  . CO2 08/07/2018 25  22 - 32 mmol/L Final  . Glucose, Bld 08/07/2018 72  70 - 99 mg/dL Final  . BUN 72/53/6644 16  6 - 20 mg/dL Final  . Creatinine, Ser 08/07/2018 0.69  0.44 - 1.00 mg/dL Final  . Calcium 03/47/4259 9.4  8.9 - 10.3 mg/dL Final  . Total Protein 08/07/2018 8.0  6.5 - 8.1 g/dL Final  . Albumin 56/38/7564 3.8  3.5 - 5.0 g/dL Final  . AST 33/29/5188 22  15 - 41 U/L Final  . ALT 08/07/2018 24  0 - 44 U/L Final  . Alkaline Phosphatase 08/07/2018 114  38 - 126 U/L Final  . Total Bilirubin 08/07/2018 0.4  0.3 - 1.2 mg/dL Final  . GFR calc non Af Amer 08/07/2018 >60  >60 mL/min Final  . GFR calc Af Amer 08/07/2018 >60  >60 mL/min Final  . Anion gap 08/07/2018 7  5 - 15 Final   Performed at [facility], [address], [place], [place] [address]  . Prothrombin Time 08/07/2018 12.2  11.4 - 15.2 seconds Final  . INR 08/07/2018 0.9  0.8 - 1.2 Final   Comment: (NOTE) INR goal varies based on device and disease states. Performed at [facility], [address], [place], [place] [address]   . ABO/RH(D) 08/07/2018 B POS   Final  . Antibody Screen 08/07/2018 NEG   Final  .  Sample Expiration 08/07/2018 08/17/2018   Final  . Extend sample reason 08/07/2018    Final                   Value:NO TRANSFUSIONS OR PREGNANCY IN THE PAST 3 MONTHS Performed at [facility], [address], [place], [place] [address]   . MRSA, PCR 08/07/2018 NEGATIVE  NEGATIVE Final  . Staphylococcus aureus 08/07/2018 NEGATIVE  NEGATIVE Final   Comment: (NOTE) The Xpert SA Assay (FDA approved for NASAL specimens in patients 24 years of age and older), is one component of a comprehensive surveillance program. It is not intended to diagnose infection nor to guide or monitor treatment. Performed at [facility], [address], [place], [place] [address]   . ABO/RH(D) 08/07/2018    Final                   Value:B POS Performed at [facility], [address], [place], [place] [address]      X-Rays:No results found.  EKG: Orders placed or performed during the hospital encounter of 01/23/15  . EKG 12-Lead  . EKG 12-Lead  . ED EKG  . ED EKG  . EKG 12-Lead  . EKG 12-Lead  . EKG     Hospital Course: Patricia Thompson is a 53 y.o. who was admitted to [facility]. They were brought to the operating room on 08/14/2018 and underwent Procedure(s): TOTAL KNEE ARTHROPLASTY.  Patient tolerated the procedure well and was later transferred to the recovery room and then to the orthopaedic floor for postoperative care. They were given PO and IV analgesics for pain control following their surgery. They were given 24 hours of postoperative antibiotics of  Anti-infectives (From admission, onward)   Start     Dose/Rate Route Frequency Ordered Stop   08/14/18 2130  ceFAZolin (ANCEF) IVPB 2g/100 mL premix     2 g 200 mL/hr over 30 Minutes Intravenous Every 6 hours 08/14/18 1742 08/15/18  0259   08/14/18 1230  ceFAZolin (ANCEF) IVPB 2g/100 mL premix     2 g 200 mL/hr over 30 Minutes Intravenous On call to O.R. 08/14/18 1216 08/14/18 1514     and started on DVT prophylaxis in the form of Aspirin.   PT and OT were ordered for total joint protocol. Discharge planning consulted to help with postop disposition and equipment needs. Patient had a decent night on the evening of surgery. They started to get up OOB with therapy on POD #1. Hemovac drain was pulled without difficulty on day one. Continued to work with therapy into POD #2. Pt was seen during rounds on day two and was ready to go home pending progress with therapy. Dressing was changed and the incision was clean, dry, and intact with no drainage. Pt worked with therapy for one additional session and was meeting their goals. She was discharged to home later that day in stable condition.  Diet: Regular diet Activity: WBAT Follow-up: in 2 weeks Disposition: Home with outpatient PT at [facility] Discharged Condition: stable    Allergies as of 08/16/2018      Reactions   Vicodin [hydrocodone-acetaminophen] Nausea And Vomiting      Medication List    STOP taking these medications   diclofenac 50 MG EC tablet Commonly known as:  VOLTAREN   ibuprofen 200 MG tablet Commonly known as:  ADVIL,MOTRIN     TAKE these medications   aspirin 325 MG EC  tablet Take 1 tablet (325 mg total) by mouth 2 (two) times daily for 19 days. Then take one 81 mg aspirin once a day for three weeks. Then discontinue aspirin.   Biotin 24580 MCG Tabs Take 20,000 mcg by mouth daily.   gabapentin 300 MG capsule Commonly known as:  NEURONTIN Take 1 capsule (300 mg total) by mouth 3 (three) times daily. Take a 300 mg capsule three times a day for two weeks following surgery.Then take a 300 mg capsule two times a day for two weeks. Then take a 300 mg capsule once a day for two weeks. Then discontinue.   methocarbamol 500 MG tablet Commonly  known as:  ROBAXIN Take 1 tablet (500 mg total) by mouth every 6 (six) hours as needed for muscle spasms.   omeprazole 20 MG capsule Commonly known as:  PRILOSEC Take 20 mg by mouth as needed.   OVER THE COUNTER MEDICATION Take 2 tablets by mouth daily. Keratin   oxyCODONE 5 MG immediate release tablet Commonly known as:  Oxy IR/ROXICODONE Take 1-2 tablets (5-10 mg total) by mouth every 6 (six) hours as needed for severe pain.   traMADol 50 MG tablet Commonly known as:  ULTRAM Take 1-2 tablets (50-100 mg total) by mouth every 6 (six) hours as needed for moderate pain.      Follow-up Information    Ollen Gross, MD. Schedule an appointment as soon as possible for a visit on 08/29/2018.   Specialty:  Orthopedic Surgery Contact information: [address] [place] [address] [place] [address] [phone]           Signed: Arther Abbott, PA-C Orthopedic Surgery 08/23/2018, 10:56 AM

## 2018-08-23 NOTE — Therapy (Signed)
[facility] [address] [place], [place], [address] Phone: [phone]   Fax:  [phone]  Physical Therapy Treatment  Patient Details  Name: Patricia Thompson MRN: [id number] Date of Birth: 12/13/1965 Referring Provider (PT): Ollen Gross, MD   Encounter Date: 08/23/2018  PT End of Session - 08/23/18 1426    Visit Number  2    Number of Visits  12    Date for PT Re-Evaluation  09/25/18    Authorization Type  FOTO;Progress note every 10th visit    PT Start Time  1349    PT Stop Time  1436    PT Time Calculation (min)  47 min    Activity Tolerance  Patient tolerated treatment well    Behavior During Therapy  [facility] for tasks assessed/performed       Past Medical History:  Diagnosis Date  . GERD (gastroesophageal reflux disease)    occasional  . OA (osteoarthritis)    knees    Past Surgical History:  Procedure Laterality Date  . COLONOSCOPY  06/2018  . DILATION AND CURETTAGE OF UTERUS  age 39  . TONSILLECTOMY  age 80  . 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  Subjective Assessment - 08/23/18 1354    Subjective  Patient arrived with ongoing pain.    Pertinent History  right TKA 08/14/2018    Limitations  Standing;Walking;Sitting;House hold activities    Diagnostic tests  x-ray    Patient Stated Goals  walk better, move better    Currently in Pain?  Yes    Pain Score  6     Pain Location  Knee    Pain Orientation  Right    Pain Descriptors / Indicators  Tightness    Pain Type  Surgical pain    Pain Onset  1 to 4 weeks ago    Pain Frequency  Constant    Aggravating Factors   prolong movement or standing    Pain Relieving Factors  meds and rest                       [facility] Adult PT Treatment/Exercise - 08/23/18 0001      Exercises   Exercises  Knee/Hip      Knee/Hip Exercises: Aerobic   Nustep  L3 x34min (seat 8)      Knee/Hip Exercises: Seated   Long Arc Quad  Strengthening;AROM;Right;2 sets;10 reps      Knee/Hip Exercises: Supine   Short Arc Quad Sets  Strengthening;Right;AROM;2 sets;10 reps    Straight Leg Raises  Strengthening;Right;AROM;2 sets;10 reps      Modalities   Modalities  Vasopneumatic      Vasopneumatic   Number Minutes Vasopneumatic   15 minutes    Vasopnuematic Location   Knee    Vasopneumatic Pressure  Low    Vasopneumatic Temperature   34      Manual Therapy   Manual Therapy  Passive ROM    Passive ROM  gentle manual PROM for flexion with low load holds followed by ext overpressure to improve mobility               PT Short Term Goals - 08/23/18 1357      PT SHORT TERM GOAL #1   Title  Patient will  be independent with initial HEP    Time  2    Period  Weeks    Status  On-going      PT SHORT TERM GOAL #2   Title  Patient will demonstrate 90 degrees of right knee flexion PROM to improve ROM.    Time  2    Period  Weeks      PT SHORT TERM GOAL #3   Title  Patient will demonstrate 5 degrees or less of right knee extension ROM to improve ROM    Time  2    Period  Weeks    Status  On-going        PT Long Term Goals - 08/23/18 1357      PT LONG TERM GOAL #1   Title  Patient will be independent with advanced HEP    Time  4    Period  Weeks    Status  On-going      PT LONG TERM GOAL #2   Title  Patient will demonstrate 120+ degrees of right knee flexion AROM to improve ability to perform functional tasks.    Time  4    Period  Weeks    Status  On-going      PT LONG TERM GOAL #3   Title  Patient will demonstrate 3 degrees or less of right knee extension AROM to improve gait mechanics.    Time  4    Period  Weeks    Status  On-going      PT LONG TERM GOAL #4   Title  Patient wil demonstrate 4/5 or greater of right knee MMT in all planes to improve  stability during functional tasks.    Status  On-going      PT LONG TERM GOAL #5   Title  Patient will report ability to perform ADLs with right knee pain less than or equal to 3/10.    Time  4    Period  Weeks    Status  On-going            Plan - 08/23/18 1427    Clinical Impression Statement  Patient tolerated treatment well today. Patient able to progress with right LE exercises and did well on nustep and slow progression to seat 8 for ROM. Patient has ongoing pain and swelling yet is taking meds and using ice at home. Patient was educated on inital HEP and TKR protocol. Patient progressing toward goals.     Examination-Activity Limitations  Bathing;Stand;Dressing;Stairs;Sleep    Rehab Potential  Good    PT Frequency  3x / week    PT Duration  4 weeks    PT Treatment/Interventions  ADLs/Self Care Home Management;Electrical Stimulation;Cryotherapy;Moist Heat;Gait training;Stair training;Functional mobility training;Manual techniques;Neuromuscular re-education;Balance training;Therapeutic exercise;Therapeutic activities;Patient/family education;Taping;Vasopneumatic Device;Passive range of motion    PT Next Visit Plan  cont with POC for strenthening / knee PROM and AROM, Modalities PRN for pain relief    Consulted and Agree with Plan of Care  Patient       Patient will benefit from skilled therapeutic intervention in order to improve the following deficits and impairments:  Pain, Decreased activity tolerance, Decreased range of motion, Decreased strength, Difficulty walking, Increased edema  Visit Diagnosis: Acute pain of right knee  Stiffness of right knee, not elsewhere classified  Difficulty in walking, not elsewhere classified     Problem List Patient Active Problem List   Diagnosis Date Noted  . OA (osteoarthritis) of knee 08/14/2018  .  Osteoarthritis of right knee 08/14/2018    DUNFORD, CHRISTINA P, PTA 08/23/2018, 2:36 PM  [facility] [address] [place], [place], [address] Phone: [phone]   Fax:  [phone]  Name: Patricia Thompson MRN: [id number] Date of Birth: Jan 24, 1966

## 2018-08-24 ENCOUNTER — Ambulatory Visit: Payer: BLUE CROSS/BLUE SHIELD | Admitting: *Deleted

## 2018-08-24 ENCOUNTER — Other Ambulatory Visit: Payer: Self-pay

## 2018-08-24 DIAGNOSIS — R262 Difficulty in walking, not elsewhere classified: Secondary | ICD-10-CM

## 2018-08-24 DIAGNOSIS — M25661 Stiffness of right knee, not elsewhere classified: Secondary | ICD-10-CM

## 2018-08-24 DIAGNOSIS — M25561 Pain in right knee: Secondary | ICD-10-CM

## 2018-08-24 NOTE — Therapy (Signed)
[facility] [address] [place], [place], [address] Phone: [phone]   Fax:  [phone]  Physical Therapy Treatment  Patient Details  Name: Patricia Thompson MRN: [id number] Date of Birth: 1965/11/01 Referring Provider (PT): Ollen Gross, MD   Encounter Date: 08/24/2018  PT End of Session - 08/24/18 1353    Visit Number  3    Number of Visits  12    Date for PT Re-Evaluation  09/25/18    Authorization Type  FOTO;Progress note every 10th visit    PT Start Time  1300    PT Stop Time  1354    PT Time Calculation (min)  54 min       Past Medical History:  Diagnosis Date  . GERD (gastroesophageal reflux disease)    occasional  . OA (osteoarthritis)    knees    Past Surgical History:  Procedure Laterality Date  . COLONOSCOPY  06/2018  . DILATION AND CURETTAGE OF UTERUS  age 20  . TONSILLECTOMY  age 41  . 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                    [facility] Adult PT Treatment/Exercise - 08/24/18 0001      Exercises   Exercises  Knee/Hip      Knee/Hip Exercises: Aerobic   Nustep  L3 x64min (seat 8, 7)      Knee/Hip Exercises: Seated   Long Arc Quad  Strengthening;AROM;Right;2 sets;10 reps      Modalities   Modalities  Vasopneumatic      Vasopneumatic   Number Minutes Vasopneumatic   15 minutes    Vasopnuematic Location   Knee    Vasopneumatic Pressure  Low    Vasopneumatic Temperature   34      Manual Therapy   Manual Therapy  Passive ROM    Passive ROM  gentle manual PROM for flexion and extension with low load holds followed by ext overpressure to improve mobility               PT Short Term Goals - 08/23/18 1357      PT SHORT TERM GOAL #1   Title  Patient will be independent with initial  HEP    Time  2    Period  Weeks    Status  On-going      PT SHORT TERM GOAL #2   Title  Patient will demonstrate 90 degrees of right knee flexion PROM to improve ROM.    Time  2    Period  Weeks      PT SHORT TERM GOAL #3   Title  Patient will demonstrate 5 degrees or less of right knee extension ROM to improve ROM    Time  2    Period  Weeks    Status  On-going        PT Long Term Goals - 08/23/18 1357      PT LONG TERM GOAL #1   Title  Patient will be independent with advanced HEP    Time  4    Period  Weeks    Status  On-going      PT LONG TERM GOAL #2   Title  Patient will demonstrate 120+ degrees of right knee flexion  AROM to improve ability to perform functional tasks.    Time  4    Period  Weeks    Status  On-going      PT LONG TERM GOAL #3   Title  Patient will demonstrate 3 degrees or less of right knee extension AROM to improve gait mechanics.    Time  4    Period  Weeks    Status  On-going      PT LONG TERM GOAL #4   Title  Patient wil demonstrate 4/5 or greater of right knee MMT in all planes to improve stability during functional tasks.    Status  On-going      PT LONG TERM GOAL #5   Title  Patient will report ability to perform ADLs with right knee pain less than or equal to 3/10.    Time  4    Period  Weeks    Status  On-going            Plan - 08/24/18 1303    Clinical Impression Statement  Pt arrived today with RT knee sore and stiff still. Pt was able to perform nustep for ROM and progress to seat 7 and did well with therex. ROM still limited with focus on extension. Normal modality response.    Examination-Activity Limitations  Bathing;Stand;Dressing;Stairs;Sleep    Stability/Clinical Decision Making  Stable/Uncomplicated    Clinical Decision Making  Low    Rehab Potential  Good    PT Frequency  3x / week    PT Duration  4 weeks    PT Next Visit Plan  cont with POC for strenthening / knee PROM and AROM, Modalities PRN for pain relief     PT Home Exercise Plan  see patient education section    Consulted and Agree with Plan of Care  Patient       Patient will benefit from skilled therapeutic intervention in order to improve the following deficits and impairments:  Pain, Decreased activity tolerance, Decreased range of motion, Decreased strength, Difficulty walking, Increased edema  Visit Diagnosis: Acute pain of right knee  Stiffness of right knee, not elsewhere classified  Difficulty in walking, not elsewhere classified     Problem List Patient Active Problem List   Diagnosis Date Noted  . OA (osteoarthritis) of knee 08/14/2018  . Osteoarthritis of right knee 08/14/2018    ,CHRIS, PTA 08/24/2018, 2:43 PM  [facility] [address] [place], [place], [address] Phone: [phone]   Fax:  [phone]  Name: Patricia Thompson MRN: [id number] Date of Birth: 25-Dec-1965

## 2018-08-28 ENCOUNTER — Ambulatory Visit: Payer: BLUE CROSS/BLUE SHIELD | Admitting: Physical Therapy

## 2018-08-31 ENCOUNTER — Encounter: Payer: BLUE CROSS/BLUE SHIELD | Admitting: *Deleted

## 2018-10-24 ENCOUNTER — Ambulatory Visit: Payer: BC Managed Care – PPO | Attending: Orthopedic Surgery | Admitting: Physical Therapy

## 2018-10-24 ENCOUNTER — Other Ambulatory Visit: Payer: Self-pay

## 2018-10-24 DIAGNOSIS — M25561 Pain in right knee: Secondary | ICD-10-CM | POA: Insufficient documentation

## 2018-10-24 DIAGNOSIS — M25661 Stiffness of right knee, not elsewhere classified: Secondary | ICD-10-CM | POA: Diagnosis present

## 2018-10-24 DIAGNOSIS — R262 Difficulty in walking, not elsewhere classified: Secondary | ICD-10-CM | POA: Insufficient documentation

## 2018-10-24 NOTE — Therapy (Signed)
[facility] [address] [place], [place], [address] Phone: [phone]   Fax:  [phone]  Physical Therapy Treatment  Patient Details  Name: Patricia Thompson MRN: [id number] Date of Birth: 12/27/1965 Referring Provider (PT): Ollen GrossFrank Aluisio, MD   Encounter Date: 10/24/2018  PT End of Session - 10/24/18 1440    Visit Number  4    Number of Visits  18   per new order   Date for PT Re-Evaluation  12/29/18    Authorization Type  FOTO;Progress note every 10th visit    PT Start Time  1430    PT Stop Time  1532    PT Time Calculation (min)  62 min    Activity Tolerance  Patient tolerated treatment well    Behavior During Therapy  [facility] for tasks assessed/performed       Past Medical History:  Diagnosis Date  . GERD (gastroesophageal reflux disease)    occasional  . OA (osteoarthritis)    knees    Past Surgical History:  Procedure Laterality Date  . COLONOSCOPY  06/2018  . DILATION AND CURETTAGE OF UTERUS  age 53  . TONSILLECTOMY  age 89  . 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  Subjective Assessment - 10/24/18 1559    Subjective  COVID-19 screening performed prior to patient entering the building. Patient arrives to physical therapy with reports of ongoing right knee stiffness and pain secondary to a right total knee replacement on August 14, 2018. Patient states she has been compliant with HEP. Patient reports pain wakes her up at night and pain localize to the of the knee. Patient states she is able to perform all ADLs without reports of pain but does take her increased time due to lack of ROM.     Pertinent History  right TKA 08/14/2018    Limitations  Standing;Walking;Sitting;House hold activities    Diagnostic tests  x-ray    Patient Stated Goals   walk better, move better    Currently in Pain?  Yes   did not provide pain scale        [facility] - 10/24/18 0001      Assessment   Medical Diagnosis  unilateral primary osteoarthritis, right knee    Referring Provider (PT)  Ollen GrossFrank Aluisio, MD    Onset Date/Surgical Date  08/14/18    Next MD Visit  November 16, 2018    Prior Therapy  no      Precautions   Precautions  Other (comment)    Precaution Comments  No Ultrasound      Observation/Other Assessments-Edema    Edema  Circumferential      Circumferential Edema   Circumferential - Right  43.0 cm at mid patella    Circumferential - Left   37.5 cm at mid patella      ROM / Strength   AROM / PROM / Strength  AROM;PROM      AROM   AROM Assessment Site  Knee    Right/Left Knee  Right    Right Knee Extension  14    Right Knee Flexion  93      PROM   PROM Assessment Site  Knee    Right/Left Knee  Right    Right Knee Extension  10  Right Knee Flexion  100      Palpation   Palpation comment  Tender to palpation to medial hamstring tendons      Ambulation/Gait   Gait Pattern  Step-through pattern;Decreased stance time - right;Decreased stride length;Right flexed knee in stance;Trunk flexed;Wide base of support;Abducted- right                   [facility] Adult PT Treatment/Exercise - 10/24/18 0001      Exercises   Exercises  Knee/Hip      Knee/Hip Exercises: Aerobic   Nustep  L2 x10 min seat 7 to seat 5      Modalities   Modalities  Vasopneumatic      Vasopneumatic   Number Minutes Vasopneumatic   15 minutes    Vasopnuematic Location   Knee    Vasopneumatic Pressure  Low    Vasopneumatic Temperature   34      Manual Therapy   Manual Therapy  Passive ROM;Joint mobilization    Joint Mobilization  patella joint mobs in all planes to improve ROM    Passive ROM  PROM in supine for flexion and extension with low load holds followed by ext overpressure to improve mobility oscillations to decrease  pain and promote relaxation             PT Education - 10/24/18 1603    Education Details  prone hang    Person(s) Educated  Patient    Methods  Explanation;Demonstration;Handout    Comprehension  Verbalized understanding;Returned demonstration       PT Short Term Goals - 10/24/18 1524      PT SHORT TERM GOAL #1   Title  Patient will be independent with initial HEP    Time  2    Period  Weeks    Status  On-going      PT SHORT TERM GOAL #2   Title  Patient will demonstrate 90 degrees of right knee flexion PROM to improve ROM.    Time  2    Period  Weeks    Status  Achieved      PT SHORT TERM GOAL #3   Title  Patient will demonstrate 5 degrees or less of right knee extension ROM to improve ROM    Time  2    Period  Weeks    Status  On-going        PT Long Term Goals - 10/24/18 1525      PT LONG TERM GOAL #1   Title  Patient will be independent with advanced HEP    Time  4    Period  Weeks    Status  On-going      PT LONG TERM GOAL #2   Title  Patient will demonstrate 120+ degrees of right knee flexion AROM to improve ability to perform functional tasks.    Time  4    Period  Weeks    Status  On-going      PT LONG TERM GOAL #3   Title  Patient will demonstrate 3 degrees or less of right knee extension AROM to improve gait mechanics.    Time  4    Period  Weeks    Status  On-going      PT LONG TERM GOAL #4   Title  Patient wil demonstrate 4/5 or greater of right knee MMT in all planes to improve stability during functional tasks.    Time  4  Period  Weeks    Status  On-going      PT LONG TERM GOAL #5   Title  Patient will report ability to perform ADLs with right knee pain less than or equal to 3/10.    Time  4    Period  Weeks    Status  Achieved            Plan - 10/24/18 1514    Clinical Impression Statement  Patient arrived with ongoing right knee soreness. Patient was able to tolerate Nustep and PROM well despite reports of soreness.  Patient noted with 10-100 degrees of right knee PROM. Patient noted with decreased right knee patella mobilizations. Patient and PT discussed  plan of care and was provided with HEP for prone hang. Patient reported understanding and agreement. Normal response to modalities upon removal.     Examination-Activity Limitations  Bathing;Stand;Dressing;Stairs;Sleep    Stability/Clinical Decision Making  Stable/Uncomplicated    Clinical Decision Making  Low    Rehab Potential  Good    PT Frequency  3x / week    PT Duration  4 weeks    PT Treatment/Interventions  ADLs/Self Care Home Management;Electrical Stimulation;Cryotherapy;Moist Heat;Gait training;Stair training;Functional mobility training;Manual techniques;Neuromuscular re-education;Balance training;Therapeutic exercise;Therapeutic activities;Patient/family education;Taping;Vasopneumatic Device;Passive range of motion    PT Next Visit Plan  Nustep, progressive AROM and PROM, patella mobilization, modalities PRN for pain relief.    PT Home Exercise Plan  see patient education section    Consulted and Agree with Plan of Care  Patient       Patient will benefit from skilled therapeutic intervention in order to improve the following deficits and impairments:  Pain, Decreased activity tolerance, Decreased range of motion, Decreased strength, Difficulty walking, Increased edema  Visit Diagnosis: Acute pain of right knee - Plan: PT plan of care cert/re-cert  Stiffness of right knee, not elsewhere classified - Plan: PT plan of care cert/re-cert  Difficulty in walking, not elsewhere classified - Plan: PT plan of care cert/re-cert     Problem List Patient Active Problem List   Diagnosis Date Noted  . OA (osteoarthritis) of knee 08/14/2018  . Osteoarthritis of right knee 08/14/2018    Guss Bunde, PT, DPT 10/24/2018, 4:09 PM  [facility] [address] [place], [place], [address] Phone:  [phone]   Fax:  [phone]  Name: Patricia Thompson MRN: [id number] Date of Birth: 1966-04-14

## 2018-10-26 ENCOUNTER — Encounter: Payer: Self-pay | Admitting: Physical Therapy

## 2018-10-26 ENCOUNTER — Other Ambulatory Visit: Payer: Self-pay

## 2018-10-26 ENCOUNTER — Ambulatory Visit: Payer: BC Managed Care – PPO | Admitting: Physical Therapy

## 2018-10-26 DIAGNOSIS — M25661 Stiffness of right knee, not elsewhere classified: Secondary | ICD-10-CM

## 2018-10-26 DIAGNOSIS — M25561 Pain in right knee: Secondary | ICD-10-CM

## 2018-10-26 DIAGNOSIS — R262 Difficulty in walking, not elsewhere classified: Secondary | ICD-10-CM

## 2018-10-26 NOTE — Therapy (Signed)
[facility] [address] [place], [place], [address] Phone: [phone]   Fax:  [phone]  Physical Therapy Treatment  Patient Details  Name: Patricia Thompson MRN: [id number] Date of Birth: 1966-02-26 Referring Provider (PT): Ollen Gross, MD   Encounter Date: 10/26/2018  PT End of Session - 10/26/18 1553    Visit Number  5    Number of Visits  18    Date for PT Re-Evaluation  12/29/18    Authorization Type  FOTO;Progress note every 10th visit    PT Start Time  1430    PT Stop Time  1533    PT Time Calculation (min)  63 min    Activity Tolerance  Patient tolerated treatment well    Behavior During Therapy  [facility] for tasks assessed/performed       Past Medical History:  Diagnosis Date  . GERD (gastroesophageal reflux disease)    occasional  . OA (osteoarthritis)    knees    Past Surgical History:  Procedure Laterality Date  . COLONOSCOPY  06/2018  . DILATION AND CURETTAGE OF UTERUS  age 80  . TONSILLECTOMY  age 43  . 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  Subjective Assessment - 10/26/18 1439    Subjective  COVID-19 screening performed prior to patient entering the building. Patient reported ongoing stiffness.    Pertinent History  right TKA 08/14/2018    Limitations  Standing;Walking;Sitting;House hold activities    Diagnostic tests  x-ray    Patient Stated Goals  walk better, move better    Currently in Pain?  Yes    Pain Score  3     Pain Location  Knee    Pain Orientation  Right    Pain Descriptors / Indicators  Tightness    Pain Onset  1 to 4 weeks ago    Pain Frequency  Constant         [facility] PT Assessment - 10/26/18 0001      Assessment   Medical Diagnosis  unilateral primary osteoarthritis, right knee    Referring Provider  (PT)  Ollen Gross, MD    Onset Date/Surgical Date  08/14/18    Next MD Visit  November 16, 2018    Prior Therapy  no      Precautions   Precautions  Other (comment)    Precaution Comments  No Ultrasound                   [facility] Adult PT Treatment/Exercise - 10/26/18 0001      Exercises   Exercises  Knee/Hip      Knee/Hip Exercises: Aerobic   Nustep  L4 x12 min seat 6 to seat 3      Modalities   Modalities  Vasopneumatic      Electrical Stimulation   Electrical Stimulation Location  right knee    Electrical Stimulation Action  IFC    Electrical Stimulation Parameters  80-150 hz x15 mins    Electrical Stimulation Goals  Pain      Vasopneumatic   Number Minutes Vasopneumatic   15 minutes    Vasopnuematic Location   Knee    Vasopneumatic Pressure  Low    Vasopneumatic Temperature   34      Manual Therapy   Manual Therapy  Passive ROM;Joint mobilization;Soft tissue mobilization    Joint Mobilization  patella joint mobs in all planes to improve ROM    Soft tissue mobilization  STW/M to superior right knee, IASTM to medial and lateral aspect of right knee    Passive ROM  PROM in supine for flexion and extension with low load holds followed by ext overpressure to improve mobility oscillations to decrease pain and promote relaxation               PT Short Term Goals - 10/24/18 1524      PT SHORT TERM GOAL #1   Title  Patient will be independent with initial HEP    Time  2    Period  Weeks    Status  On-going      PT SHORT TERM GOAL #2   Title  Patient will demonstrate 90 degrees of right knee flexion PROM to improve ROM.    Time  2    Period  Weeks    Status  Achieved      PT SHORT TERM GOAL #3   Title  Patient will demonstrate 5 degrees or less of right knee extension ROM to improve ROM    Time  2    Period  Weeks    Status  On-going        PT Long Term Goals - 10/24/18 1525      PT LONG TERM GOAL #1   Title  Patient will be independent with  advanced HEP    Time  4    Period  Weeks    Status  On-going      PT LONG TERM GOAL #2   Title  Patient will demonstrate 120+ degrees of right knee flexion AROM to improve ability to perform functional tasks.    Time  4    Period  Weeks    Status  On-going      PT LONG TERM GOAL #3   Title  Patient will demonstrate 3 degrees or less of right knee extension AROM to improve gait mechanics.    Time  4    Period  Weeks    Status  On-going      PT LONG TERM GOAL #4   Title  Patient wil demonstrate 4/5 or greater of right knee MMT in all planes to improve stability during functional tasks.    Time  4    Period  Weeks    Status  On-going      PT LONG TERM GOAL #5   Title  Patient will report ability to perform ADLs with right knee pain less than or equal to 3/10.    Time  4    Period  Weeks    Status  Achieved            Plan - 10/26/18 1553    Clinical Impression Statement  Patient was able to tolerate treatment well. Patient noted with increased tenderness to most superior aspect of the scar. Patient tolerated gentle IASTM to medial and lateral quads and reported a relase in tightness. Normal response to modalities upon removal.     Examination-Activity Limitations  Bathing;Stand;Dressing;Stairs;Sleep    Stability/Clinical Decision Making  Stable/Uncomplicated    Clinical Decision Making  Low    Rehab Potential  Good    PT Frequency  3x / week    PT Duration  4 weeks    PT Treatment/Interventions  ADLs/Self Care Home Management;Electrical Stimulation;Cryotherapy;Moist Heat;Gait training;Stair training;Functional mobility  training;Manual techniques;Neuromuscular re-education;Balance training;Therapeutic exercise;Therapeutic activities;Patient/family education;Taping;Vasopneumatic Device;Passive range of motion    PT Next Visit Plan  Nustep, progressive AROM and PROM, patella mobilization, modalities PRN for pain relief.    Consulted and Agree with Plan of Care  Patient        Patient will benefit from skilled therapeutic intervention in order to improve the following deficits and impairments:  Pain, Decreased activity tolerance, Decreased range of motion, Decreased strength, Difficulty walking, Increased edema  Visit Diagnosis: Acute pain of right knee  Stiffness of right knee, not elsewhere classified  Difficulty in walking, not elsewhere classified     Problem List Patient Active Problem List   Diagnosis Date Noted  . OA (osteoarthritis) of knee 08/14/2018  . Osteoarthritis of right knee 08/14/2018   Guss Bunde, PT, DPT 10/26/2018, 4:06 PM  [facility] [address] [place], [place], [address] Phone: [phone]   Fax:  [phone]  Name: Patricia Thompson MRN: [id number] Date of Birth: [birth year]

## 2018-10-31 ENCOUNTER — Encounter: Payer: Self-pay | Admitting: Physical Therapy

## 2018-10-31 ENCOUNTER — Ambulatory Visit: Payer: BC Managed Care – PPO | Admitting: Physical Therapy

## 2018-10-31 ENCOUNTER — Other Ambulatory Visit: Payer: Self-pay

## 2018-10-31 DIAGNOSIS — M25561 Pain in right knee: Secondary | ICD-10-CM

## 2018-10-31 DIAGNOSIS — R262 Difficulty in walking, not elsewhere classified: Secondary | ICD-10-CM

## 2018-10-31 DIAGNOSIS — M25661 Stiffness of right knee, not elsewhere classified: Secondary | ICD-10-CM

## 2018-10-31 NOTE — Therapy (Signed)
[facility] [address] [place], [place], [address] Phone: [phone]   Fax:  [phone]  Physical Therapy Treatment  Patient Details  Name: DEMITA TOBIA MRN: [id number] Date of Birth: 20-May-1966 Referring Provider (PT): Ollen Gross, MD   Encounter Date: 10/31/2018  PT End of Session - 10/31/18 1442    Visit Number  6    Number of Visits  18    Date for PT Re-Evaluation  12/29/18    Authorization Type  FOTO;Progress note every 10th visit    PT Start Time  1403    PT Stop Time  1500    PT Time Calculation (min)  57 min    Activity Tolerance  Patient tolerated treatment well    Behavior During Therapy  [facility] for tasks assessed/performed       Past Medical History:  Diagnosis Date  . GERD (gastroesophageal reflux disease)    occasional  . OA (osteoarthritis)    knees    Past Surgical History:  Procedure Laterality Date  . COLONOSCOPY  06/2018  . DILATION AND CURETTAGE OF UTERUS  age 53  . TONSILLECTOMY  age 53  . 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  Subjective Assessment - 10/31/18 1437    Subjective  COVID-19 screening performed prior to patient entering the building. Patient reported feeling alright and is consistent with HEP.    Pertinent History  right TKA 08/14/2018    Limitations  Standing;Walking;Sitting;House hold activities    Diagnostic tests  x-ray    Patient Stated Goals  walk better, move better    Currently in Pain?  Yes    Pain Location  Knee    Pain Orientation  Right    Pain Descriptors / Indicators  Tightness    Pain Type  Surgical pain    Pain Onset  1 to 4 weeks ago    Pain Frequency  Constant         [facility] PT Assessment - 10/31/18 0001      Assessment   Medical Diagnosis  unilateral primary osteoarthritis,  right knee    Referring Provider (PT)  Ollen Gross, MD    Onset Date/Surgical Date  08/14/18    Next MD Visit  November 16, 2018    Prior Therapy  no      Precautions   Precautions  Other (comment)    Precaution Comments  No Ultrasound      PROM   Right Knee Extension  10    Right Knee Flexion  105                   [facility] Adult PT Treatment/Exercise - 10/31/18 0001      Exercises   Exercises  Knee/Hip      Knee/Hip Exercises: Stretches   Passive Hamstring Stretch  Right;3 reps;30 seconds    Knee: Self-Stretch to increase Flexion  Right;3 reps;30 seconds    Knee: Self-Stretch Limitations  on step      Knee/Hip Exercises: Aerobic   Nustep  L4 x12 min seat 6 to seat 3      Knee/Hip Exercises: Standing   Forward Lunges  --      Modalities   Modalities  Vasopneumatic;Electrical Stimulation      Programme researcher, broadcasting/film/video Location  right knee    Electrical Stimulation Action  IFC    Electrical Stimulation Parameters  80-150 hz x15 mins    Electrical Stimulation Goals  Pain      Vasopneumatic   Number Minutes Vasopneumatic   15 minutes    Vasopnuematic Location   Knee    Vasopneumatic Pressure  Low    Vasopneumatic Temperature   34      Manual Therapy   Manual Therapy  Passive ROM;Joint mobilization;Soft tissue mobilization    Joint Mobilization  patella joint mobs in all planes to improve ROM    Passive ROM  PROM in supine for flexion and extension with low load holds followed by ext overpressure to improve mobility oscillations to decrease pain and promote relaxation               PT Short Term Goals - 10/24/18 1524      PT SHORT TERM GOAL #1   Title  Patient will be independent with initial HEP    Time  2    Period  Weeks    Status  On-going      PT SHORT TERM GOAL #2   Title  Patient will demonstrate 90 degrees of right knee flexion PROM to improve ROM.    Time  2    Period  Weeks    Status  Achieved      PT SHORT  TERM GOAL #3   Title  Patient will demonstrate 5 degrees or less of right knee extension ROM to improve ROM    Time  2    Period  Weeks    Status  On-going        PT Long Term Goals - 10/24/18 1525      PT LONG TERM GOAL #1   Title  Patient will be independent with advanced HEP    Time  4    Period  Weeks    Status  On-going      PT LONG TERM GOAL #2   Title  Patient will demonstrate 120+ degrees of right knee flexion AROM to improve ability to perform functional tasks.    Time  4    Period  Weeks    Status  On-going      PT LONG TERM GOAL #3   Title  Patient will demonstrate 3 degrees or less of right knee extension AROM to improve gait mechanics.    Time  4    Period  Weeks    Status  On-going      PT LONG TERM GOAL #4   Title  Patient wil demonstrate 4/5 or greater of right knee MMT in all planes to improve stability during functional tasks.    Time  4    Period  Weeks    Status  On-going      PT LONG TERM GOAL #5   Title  Patient will report ability to perform ADLs with right knee pain less than or equal to 3/10.    Time  4    Period  Weeks    Status  Achieved            Plan - 10/31/18 1442    Clinical Impression Statement  Patient was able to tolerate progression of treatment well despite reports of ongoing stiffness. Patient required tactile cuing for proper form and technique for self-knee flexion on the step and passive hamstring stretch. Patient's PROM measured at 10-105 degrees. Normal response to modalities upon removal.  Examination-Activity Limitations  Bathing;Stand;Dressing;Stairs;Sleep    Stability/Clinical Decision Making  Stable/Uncomplicated    Clinical Decision Making  Low    Rehab Potential  Good    PT Frequency  3x / week    PT Duration  4 weeks    PT Treatment/Interventions  ADLs/Self Care Home Management;Electrical Stimulation;Cryotherapy;Moist Heat;Gait training;Stair training;Functional mobility training;Manual  techniques;Neuromuscular re-education;Balance training;Therapeutic exercise;Therapeutic activities;Patient/family education;Taping;Vasopneumatic Device;Passive range of motion    PT Next Visit Plan  Nustep, progressive AROM and PROM, patella mobilization, modalities PRN for pain relief.    PT Home Exercise Plan  see patient education section    Consulted and Agree with Plan of Care  Patient       Patient will benefit from skilled therapeutic intervention in order to improve the following deficits and impairments:  Pain, Decreased activity tolerance, Decreased range of motion, Decreased strength, Difficulty walking, Increased edema  Visit Diagnosis: Acute pain of right knee  Stiffness of right knee, not elsewhere classified  Difficulty in walking, not elsewhere classified     Problem List Patient Active Problem List   Diagnosis Date Noted  . OA (osteoarthritis) of knee 08/14/2018  . Osteoarthritis of right knee 08/14/2018   Guss Bunde, PT, DPT 10/31/2018, 3:15 PM  [facility] [address] [place], [place], [address] Phone: [phone]   Fax:  [phone]  Name: EDYE MOHABIR MRN: [id number] Date of Birth: 03-09-1966

## 2018-11-02 ENCOUNTER — Encounter: Payer: Self-pay | Admitting: Physical Therapy

## 2018-11-02 ENCOUNTER — Ambulatory Visit: Payer: BC Managed Care – PPO | Admitting: Physical Therapy

## 2018-11-02 ENCOUNTER — Other Ambulatory Visit: Payer: Self-pay

## 2018-11-02 DIAGNOSIS — M25561 Pain in right knee: Secondary | ICD-10-CM | POA: Diagnosis not present

## 2018-11-02 DIAGNOSIS — R262 Difficulty in walking, not elsewhere classified: Secondary | ICD-10-CM

## 2018-11-02 DIAGNOSIS — M25661 Stiffness of right knee, not elsewhere classified: Secondary | ICD-10-CM

## 2018-11-02 NOTE — Therapy (Signed)
[facility] [address] [place], [place], [address] Phone: [phone]   Fax:  [phone]  Physical Therapy Treatment  Patient Details  Name: Patricia Thompson MRN: [id number] Date of Birth: Oct 03, 1965 Referring Provider (PT): Ollen Gross, MD   Encounter Date: 11/02/2018  PT End of Session - 11/02/18 1452    Visit Number  7    Number of Visits  18    Date for PT Re-Evaluation  12/29/18    Authorization Type  FOTO;Progress note every 10th visit    PT Start Time  1403    PT Stop Time  1459    PT Time Calculation (min)  56 min    Activity Tolerance  Patient tolerated treatment well    Behavior During Therapy  [facility] for tasks assessed/performed       Past Medical History:  Diagnosis Date  . GERD (gastroesophageal reflux disease)    occasional  . OA (osteoarthritis)    knees    Past Surgical History:  Procedure Laterality Date  . COLONOSCOPY  06/2018  . DILATION AND CURETTAGE OF UTERUS  age 53  . TONSILLECTOMY  age 81  . 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  Subjective Assessment - 11/02/18 1447    Subjective  COVID-19 screening performed prior to patient entering the building. Patient reported no new complaints and states is feeling less stiff today.    Pertinent History  right TKA 08/14/2018    Limitations  Standing;Walking;Sitting;House hold activities    Diagnostic tests  x-ray    Patient Stated Goals  walk better, move better    Currently in Pain?  Yes   did not provide number on pain scale        [facility] PT Assessment - 11/02/18 0001      Assessment   Medical Diagnosis  unilateral primary osteoarthritis, right knee    Referring Provider (PT)  Ollen Gross, MD    Onset Date/Surgical Date  08/14/18    Next MD Visit  November 16, 2018    Prior Therapy  no      Precautions   Precautions  Other (comment)    Precaution Comments  No Ultrasound                   [facility] Adult PT Treatment/Exercise - 11/02/18 0001      Exercises   Exercises  Knee/Hip      Knee/Hip Exercises: Aerobic   Nustep  L4 x10 min seat 6 to seat 4      Knee/Hip Exercises: Supine   Heel Prop for Knee Extension  5 minutes   with STW/ IASTM to right quad and ITB     Knee/Hip Exercises: Prone   Prone Knee Hang  5 minutes    Prone Knee Hang Limitations  with IASTM and STW/M to lateral hamstrings and peroneals      Modalities   Modalities  Vasopneumatic;Electrical Stimulation      Electrical Stimulation   Electrical Stimulation Location  right knee    Electrical Stimulation Action  IFC    Electrical Stimulation Parameters  80-150 hz x15 mins    Electrical Stimulation Goals  Pain      Vasopneumatic   Number Minutes Vasopneumatic   15 minutes    Vasopnuematic Location  Knee    Vasopneumatic Pressure  Low    Vasopneumatic Temperature   40      Manual Therapy   Manual Therapy  Passive ROM;Joint mobilization;Soft tissue mobilization    Joint Mobilization  patella joint mobs in all planes to improve ROM, posterior glides to promote flexion    Passive ROM  PROM in supine for flexion and extension with low load holds followed by ext overpressure to improve mobility oscillations to decrease pain and promote relaxation             PT Education - 11/02/18 1452    Education Details  heel prop for extension    Person(s) Educated  Patient    Methods  Explanation;Demonstration    Comprehension  Verbalized understanding       PT Short Term Goals - 10/24/18 1524      PT SHORT TERM GOAL #1   Title  Patient will be independent with initial HEP    Time  2    Period  Weeks    Status  On-going      PT SHORT TERM GOAL #2   Title  Patient will demonstrate 90 degrees of right knee flexion PROM to improve ROM.    Time  2    Period   Weeks    Status  Achieved      PT SHORT TERM GOAL #3   Title  Patient will demonstrate 5 degrees or less of right knee extension ROM to improve ROM    Time  2    Period  Weeks    Status  On-going        PT Long Term Goals - 10/24/18 1525      PT LONG TERM GOAL #1   Title  Patient will be independent with advanced HEP    Time  4    Period  Weeks    Status  On-going      PT LONG TERM GOAL #2   Title  Patient will demonstrate 120+ degrees of right knee flexion AROM to improve ability to perform functional tasks.    Time  4    Period  Weeks    Status  On-going      PT LONG TERM GOAL #3   Title  Patient will demonstrate 3 degrees or less of right knee extension AROM to improve gait mechanics.    Time  4    Period  Weeks    Status  On-going      PT LONG TERM GOAL #4   Title  Patient wil demonstrate 4/5 or greater of right knee MMT in all planes to improve stability during functional tasks.    Time  4    Period  Weeks    Status  On-going      PT LONG TERM GOAL #5   Title  Patient will report ability to perform ADLs with right knee pain less than or equal to 3/10.    Time  4    Period  Weeks    Status  Achieved            Plan - 11/02/18 1453    Clinical Impression Statement  Patient was able to tolerate treatment fairly. Patient noted with increased distal ITB pain with IASTM therefore STW/M performed. Patient required intermittent oscillations to promote muscle relaxation. Patient provided with heel prop HEP to address knee extension deficit. Normal response to modalities upon removal.     Examination-Activity Limitations  Bathing;Stand;Dressing;Stairs;Sleep  Stability/Clinical Decision Making  Stable/Uncomplicated    Clinical Decision Making  Low    Rehab Potential  Good    PT Frequency  3x / week    PT Duration  4 weeks    PT Treatment/Interventions  ADLs/Self Care Home Management;Electrical Stimulation;Cryotherapy;Moist Heat;Gait training;Stair  training;Functional mobility training;Manual techniques;Neuromuscular re-education;Balance training;Therapeutic exercise;Therapeutic activities;Patient/family education;Taping;Vasopneumatic Device;Passive range of motion    PT Next Visit Plan  Nustep, progressive AROM and PROM, patella mobilization, modalities PRN for pain relief.    Consulted and Agree with Plan of Care  Patient       Patient will benefit from skilled therapeutic intervention in order to improve the following deficits and impairments:  Pain, Decreased activity tolerance, Decreased range of motion, Decreased strength, Difficulty walking, Increased edema  Visit Diagnosis: Acute pain of right knee  Stiffness of right knee, not elsewhere classified  Difficulty in walking, not elsewhere classified     Problem List Patient Active Problem List   Diagnosis Date Noted  . OA (osteoarthritis) of knee 08/14/2018  . Osteoarthritis of right knee 08/14/2018    Guss Bunde, PT, DPT 11/02/2018, 3:10 PM  [facility] [address] [place], [place], [address] Phone: [phone]   Fax:  [phone]  Name: Patricia Thompson MRN: [id number] Date of Birth: 09/25/1965

## 2018-11-07 ENCOUNTER — Other Ambulatory Visit: Payer: Self-pay

## 2018-11-07 ENCOUNTER — Encounter: Payer: Self-pay | Admitting: Physical Therapy

## 2018-11-07 ENCOUNTER — Ambulatory Visit: Payer: BC Managed Care – PPO | Attending: Orthopedic Surgery | Admitting: Physical Therapy

## 2018-11-07 DIAGNOSIS — M25661 Stiffness of right knee, not elsewhere classified: Secondary | ICD-10-CM | POA: Diagnosis present

## 2018-11-07 DIAGNOSIS — R262 Difficulty in walking, not elsewhere classified: Secondary | ICD-10-CM

## 2018-11-07 DIAGNOSIS — M25561 Pain in right knee: Secondary | ICD-10-CM | POA: Diagnosis not present

## 2018-11-07 NOTE — Therapy (Signed)
[facility] [address] [place], [place], [address] Phone: [phone]   Fax:  [phone]  Physical Therapy Treatment  Patient Details  Name: Patricia NieceLesley E Thompson MRN: [id number] Date of Birth: 10/30/1965 Referring Provider (PT): Ollen GrossFrank Aluisio, MD   Encounter Date: 11/07/2018  PT End of Session - 11/07/18 1404    Visit Number  8    Number of Visits  18    Date for PT Re-Evaluation  12/29/18    Authorization Type  FOTO;Progress note every 10th visit    PT Start Time  1402    PT Stop Time  1511    PT Time Calculation (min)  69 min    Activity Tolerance  Patient tolerated treatment well    Behavior During Therapy  [facility] for tasks assessed/performed       Past Medical History:  Diagnosis Date  . GERD (gastroesophageal reflux disease)    occasional  . OA (osteoarthritis)    knees    Past Surgical History:  Procedure Laterality Date  . COLONOSCOPY  06/2018  . DILATION AND CURETTAGE OF UTERUS  age 53  . TONSILLECTOMY  age 89  . 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  Subjective Assessment - 11/07/18 1403    Subjective  COVID 19 screening performed on patient upon arrival. Patient reports stiffness upon arrival as well.     Pertinent History  right TKA 08/14/2018    Limitations  Standing;Walking;Sitting;House hold activities    Diagnostic tests  x-ray    Patient Stated Goals  walk better, move better    Currently in Pain?  No/denies         [facility] - 11/07/18 0001      Assessment   Medical Diagnosis  unilateral primary osteoarthritis, right knee    Referring Provider (PT)  Ollen GrossFrank Aluisio, MD    Onset Date/Surgical Date  08/14/18    Next MD Visit  11/16/2018    Prior Therapy  no      Precautions   Precautions  Other (comment)    Precaution Comments  No Ultrasound      ROM / Strength   AROM / PROM / Strength  AROM      AROM   Overall AROM   Deficits    AROM Assessment Site  Knee    Right/Left Knee  Right    Right Knee Extension  13    Right Knee Flexion  99                   [facility] Adult PT Treatment/Exercise - 11/07/18 0001      Knee/Hip Exercises: Stretches   Passive Hamstring Stretch  Right;3 reps;30 seconds    ITB Stretch  Right;3 reps;30 seconds      Knee/Hip Exercises: Aerobic   Nustep  L4, seat 8-6 x10 min      Knee/Hip Exercises: Standing   Forward Lunges  Right;15 reps;3 seconds    Forward Step Up  Right;2 sets;10 reps;Hand Hold: 2;Step Height: 6"    Functional Squat  20 reps;3 seconds      Knee/Hip Exercises: Seated   Long Arc Quad  Strengthening;Right;20 reps;Weights    Long Arc Quad Weight  4 lbs.      Modalities   Modalities  Vasopneumatic;Electrical Stimulation  Biomedical engineer  IFC    Electrical Stimulation Parameters  80-150 hz x15 min    Electrical Stimulation Goals  Pain;Edema      Vasopneumatic   Number Minutes Vasopneumatic   15 minutes    Vasopnuematic Location   Knee    Vasopneumatic Pressure  Medium    Vasopneumatic Temperature   52      Manual Therapy   Manual Therapy  Soft tissue mobilization;Myofascial release;Passive ROM    Soft tissue mobilization  R patella mobilizations in all directions to reduce adhesions and promote proper mobility    Myofascial Release  IASTW to R Quad, ITB, HS to reduce tightness that may be limiting ROM    Passive ROM  PROM of R knee into flexion/ext with holds at end range               PT Short Term Goals - 11/07/18 1535      PT SHORT TERM GOAL #1   Title  Patient will be independent with initial HEP    Time  2    Period  Weeks    Status  Achieved      PT SHORT TERM GOAL #2   Title  Patient will demonstrate 90 degrees of  right knee flexion PROM to improve ROM.    Time  2    Period  Weeks    Status  Achieved      PT SHORT TERM GOAL #3   Title  Patient will demonstrate 5 degrees or less of right knee extension ROM to improve ROM    Time  2    Period  Weeks    Status  On-going        PT Long Term Goals - 11/07/18 1535      PT LONG TERM GOAL #1   Title  Patient will be independent with advanced HEP    Time  4    Period  Weeks    Status  Achieved      PT LONG TERM GOAL #2   Title  Patient will demonstrate 120+ degrees of right knee flexion AROM to improve ability to perform functional tasks.    Time  4    Period  Weeks    Status  On-going      PT LONG TERM GOAL #3   Title  Patient will demonstrate 3 degrees or less of right knee extension AROM to improve gait mechanics.    Time  4    Period  Weeks    Status  On-going      PT LONG TERM GOAL #4   Title  Patient wil demonstrate 4/5 or greater of right knee MMT in all planes to improve stability during functional tasks.    Time  4    Period  Weeks    Status  On-going      PT LONG TERM GOAL #5   Title  Patient will report ability to perform ADLs with right knee pain less than or equal to 3/10.    Time  4    Period  Weeks    Status  Achieved            Plan - 11/07/18 1500    Clinical Impression Statement  Patient presented in clinic with no complaints of any R knee discomfort upon arrival. R knee ROM improving per observation during therex and improved to 99  deg in supine. Passive stretching completed today as patient presented with palpable tightness of R HS and ITB. IASTW also completed to R quad, ITB and HS to reduce scar tissue and improve ROM. Patient very sensitive to manual therapy of R ITB. Normal modalities response noted following removal of the modalities.    Examination-Activity Limitations  Bathing;Stand;Dressing;Stairs;Sleep    Stability/Clinical Decision Making  Stable/Uncomplicated    Rehab Potential  Good    PT  Frequency  3x / week    PT Duration  4 weeks    PT Treatment/Interventions  ADLs/Self Care Home Management;Electrical Stimulation;Cryotherapy;Moist Heat;Gait training;Stair training;Functional mobility training;Manual techniques;Neuromuscular re-education;Balance training;Therapeutic exercise;Therapeutic activities;Patient/family education;Taping;Vasopneumatic Device;Passive range of motion    PT Next Visit Plan  Nustep, progressive AROM and PROM, patella mobilization, modalities PRN for pain relief.    PT Home Exercise Plan  see patient education section    Consulted and Agree with Plan of Care  Patient       Patient will benefit from skilled therapeutic intervention in order to improve the following deficits and impairments:  Pain, Decreased activity tolerance, Decreased range of motion, Decreased strength, Difficulty walking, Increased edema  Visit Diagnosis: Acute pain of right knee  Stiffness of right knee, not elsewhere classified  Difficulty in walking, not elsewhere classified     Problem List Patient Active Problem List   Diagnosis Date Noted  . OA (osteoarthritis) of knee 08/14/2018  . Osteoarthritis of right knee 08/14/2018    Marvell Fuller, PTA 11/07/2018, 3:35 PM  [facility] [address] [place], [place], [address] Phone: [phone]   Fax:  [phone]  Name: LEOTTA WEINGARTEN MRN: [id number] Date of Birth: 06/21/1965

## 2018-11-09 ENCOUNTER — Encounter: Payer: Self-pay | Admitting: Physical Therapy

## 2018-11-09 ENCOUNTER — Ambulatory Visit: Payer: BC Managed Care – PPO | Admitting: Physical Therapy

## 2018-11-09 DIAGNOSIS — R262 Difficulty in walking, not elsewhere classified: Secondary | ICD-10-CM

## 2018-11-09 DIAGNOSIS — M25661 Stiffness of right knee, not elsewhere classified: Secondary | ICD-10-CM

## 2018-11-09 DIAGNOSIS — M25561 Pain in right knee: Secondary | ICD-10-CM | POA: Diagnosis not present

## 2018-11-09 NOTE — Therapy (Signed)
[facility] [address] [place], [place], [address] Phone: [phone]   Fax:  [phone]  Physical Therapy Treatment  Patient Details  Name: Patricia Thompson MRN: [id number] Date of Birth: May 11, 1966 Referring Provider (PT): Ollen Gross, MD   Encounter Date: 11/09/2018  PT End of Session - 11/09/18 1414    Visit Number  9    Number of Visits  18    Date for PT Re-Evaluation  12/29/18    Authorization Type  FOTO;Progress note every 10th visit    PT Start Time  1407    PT Stop Time  1508    PT Time Calculation (min)  61 min    Activity Tolerance  Patient tolerated treatment well    Behavior During Therapy  [facility] for tasks assessed/performed       Past Medical History:  Diagnosis Date  . GERD (gastroesophageal reflux disease)    occasional  . OA (osteoarthritis)    knees    Past Surgical History:  Procedure Laterality Date  . COLONOSCOPY  06/2018  . DILATION AND CURETTAGE OF UTERUS  age 13  . TONSILLECTOMY  age 11  . 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  Subjective Assessment - 11/09/18 1414    Subjective  COVID 19 screening performed on patient upon arrival. Patient states feeling "alright"    Pertinent History  right TKA 08/14/2018    Limitations  Standing;Walking;Sitting;House hold activities    Diagnostic tests  x-ray    Patient Stated Goals  walk better, move better    Currently in Pain?  No/denies         [facility] PT Assessment - 11/09/18 0001      Assessment   Medical Diagnosis  unilateral primary osteoarthritis, right knee    Referring Provider (PT)  Ollen Gross, MD    Onset Date/Surgical Date  08/14/18    Next MD Visit  11/16/2018    Prior Therapy  no      Precautions   Precautions  Other (comment)    Precaution Comments   No Ultrasound                   [facility] Adult PT Treatment/Exercise - 11/09/18 0001      Knee/Hip Exercises: Aerobic   Nustep  L4, seat 6-3 x10 min      Knee/Hip Exercises: Seated   Long Arc Quad  Strengthening;Right;20 reps;Weights    Long Arc Quad Weight  4 lbs.    Clamshell with TheraBand  Red   x20    Hamstring Curl  Right;2 sets;10 reps      Modalities   Modalities  Vasopneumatic;Electrical Stimulation      Electrical Stimulation   Electrical Stimulation Location  R knee    Electrical Stimulation Action  IFC    Electrical Stimulation Parameters  80-150 hz x15 mins    Electrical Stimulation Goals  Pain;Edema      Vasopneumatic   Number Minutes Vasopneumatic   15 minutes    Vasopnuematic Location   Knee    Vasopneumatic Pressure  Medium      Manual Therapy   Manual Therapy  Soft tissue mobilization    Soft tissue mobilization  R patella mobilizations in all directions to reduce adhesions and promote proper mobility  Myofascial Release  IASTW to R Quad, ITB, to reduce tightness that may be limiting ROM    Passive ROM  PROM of R knee into flexion/ext with holds at end range in sitting. PNF contract relax to improve flexion               PT Short Term Goals - 11/07/18 1535      PT SHORT TERM GOAL #1   Title  Patient will be independent with initial HEP    Time  2    Period  Weeks    Status  Achieved      PT SHORT TERM GOAL #2   Title  Patient will demonstrate 90 degrees of right knee flexion PROM to improve ROM.    Time  2    Period  Weeks    Status  Achieved      PT SHORT TERM GOAL #3   Title  Patient will demonstrate 5 degrees or less of right knee extension ROM to improve ROM    Time  2    Period  Weeks    Status  On-going        PT Long Term Goals - 11/07/18 1535      PT LONG TERM GOAL #1   Title  Patient will be independent with advanced HEP    Time  4    Period  Weeks    Status  Achieved      PT LONG TERM GOAL #2   Title   Patient will demonstrate 120+ degrees of right knee flexion AROM to improve ability to perform functional tasks.    Time  4    Period  Weeks    Status  On-going      PT LONG TERM GOAL #3   Title  Patient will demonstrate 3 degrees or less of right knee extension AROM to improve gait mechanics.    Time  4    Period  Weeks    Status  On-going      PT LONG TERM GOAL #4   Title  Patient wil demonstrate 4/5 or greater of right knee MMT in all planes to improve stability during functional tasks.    Time  4    Period  Weeks    Status  On-going      PT LONG TERM GOAL #5   Title  Patient will report ability to perform ADLs with right knee pain less than or equal to 3/10.    Time  4    Period  Weeks    Status  Achieved            Plan - 11/09/18 1458    Clinical Impression Statement  Patient was able tolerate progression of treatment well but still reports ongoing hypersensitivty to scar. Patient provided with verbal cues to prevent hip hiking during seated PROM. Normal response to modalities upon removal.    Examination-Activity Limitations  Bathing;Stand;Dressing;Stairs;Sleep    Stability/Clinical Decision Making  Stable/Uncomplicated    Clinical Decision Making  Low    Rehab Potential  Good    PT Frequency  3x / week    PT Duration  4 weeks    PT Treatment/Interventions  ADLs/Self Care Home Management;Electrical Stimulation;Cryotherapy;Moist Heat;Gait training;Stair training;Functional mobility training;Manual techniques;Neuromuscular re-education;Balance training;Therapeutic exercise;Therapeutic activities;Patient/family education;Taping;Vasopneumatic Device;Passive range of motion    PT Next Visit Plan  Nustep, progressive AROM and PROM, patella mobilization, modalities PRN for pain relief.    Consulted and Agree with Plan  of Care  Patient       Patient will benefit from skilled therapeutic intervention in order to improve the following deficits and impairments:  Pain,  Decreased activity tolerance, Decreased range of motion, Decreased strength, Difficulty walking, Increased edema  Visit Diagnosis: Acute pain of right knee  Stiffness of right knee, not elsewhere classified  Difficulty in walking, not elsewhere classified     Problem List Patient Active Problem List   Diagnosis Date Noted  . OA (osteoarthritis) of knee 08/14/2018  . Osteoarthritis of right knee 08/14/2018    Guss BundeKrystle Mangawang, PT, DPT 11/09/2018, 3:33 PM  [facility] [address] [place], [place], [address] Phone: [phone]   Fax:  [phone]  Name: Patricia NieceLesley E Thompson MRN: [id number] Date of Birth: 11/12/1965

## 2018-11-14 ENCOUNTER — Other Ambulatory Visit: Payer: Self-pay

## 2018-11-14 ENCOUNTER — Ambulatory Visit: Payer: BC Managed Care – PPO | Admitting: Physical Therapy

## 2018-11-14 ENCOUNTER — Encounter: Payer: Self-pay | Admitting: Physical Therapy

## 2018-11-14 DIAGNOSIS — M25561 Pain in right knee: Secondary | ICD-10-CM

## 2018-11-14 DIAGNOSIS — M25661 Stiffness of right knee, not elsewhere classified: Secondary | ICD-10-CM

## 2018-11-14 DIAGNOSIS — R262 Difficulty in walking, not elsewhere classified: Secondary | ICD-10-CM

## 2018-11-14 NOTE — Therapy (Signed)
[facility] [place], [place], [address] Phone: [phone]   Fax:  [phone]  Physical Therapy Treatment   Progress Note Reporting Period 10/21/2018 to 11/14/2018  See note below for Objective Data and Assessment of Progress/Goals.  Patient is progressing but still has ongoing stiffness and pain. Patient is compliant with HEP.  Patient Details  Name: Patricia Thompson MRN: [id number] Date of Birth: [birth year] Referring Provider (PT): Gaynelle Arabian, MD   Encounter Date: 11/14/2018  PT End of Session - 11/14/18 1449    Visit Number  10    Number of Visits  18    Date for PT Re-Evaluation  12/29/18    Authorization Type  Progress note every 10th visit    PT Start Time  1400    PT Stop Time  1502    PT Time Calculation (min)  62 min    Activity Tolerance  Patient tolerated treatment well    Behavior During Therapy  [facility] for tasks assessed/performed       Past Medical History:  Diagnosis Date  . GERD (gastroesophageal reflux disease)    occasional  . OA (osteoarthritis)    knees    Past Surgical History:  Procedure Laterality Date  . COLONOSCOPY  06/2018  . DILATION AND CURETTAGE OF UTERUS  age 71  . TONSILLECTOMY  age 5  . 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  Subjective Assessment - 11/14/18 1448    Subjective  COVID 19 screening performed on patient upon arrival. Patient reports feeling pretty good today.    Pertinent History  right TKA 08/14/2018    Limitations  Standing;Walking;Sitting;House hold activities    Diagnostic tests  x-ray    Patient Stated Goals  walk better, move better    Currently in Pain?  No/denies         [facility]      Assessment   Medical Diagnosis  unilateral primary  osteoarthritis, right knee    Referring Provider (PT)  FGaynelle Arabian MD    Onset Date/Surgical Date  08/14/18    Next MD Visit  11/16/2018    Prior Therapy  no      Precautions   Precautions  Other (comment)    Precaution Comments  No Ultrasound      ROM / Strength   AROM / PROM / Strength  Strength      AROM   Right Knee Extension  10    Right Knee Flexion  96      PROM   Right Knee Extension  10    Right Knee Flexion  106      Strength   Strength Assessment Site  Knee    Right/Left Knee  Right    Right Knee Flexion  4+/5    Right Knee Extension  4+/5                   [facility] Adult PT Treatment/Exercise - 11/14/18 0001      Knee/Hip Exercises: Aerobic   Nustep  L5, seat 8-3 x15 min      Knee/Hip Exercises: Seated   Long Arc Quad  Strengthening;Right;20 reps;Weights    Long Arc Quad Weight  4 lbs.    Hamstring Curl  Right;2 sets;10 reps  Hamstring Limitations  green      Modalities   Modalities  Vasopneumatic;Electrical Stimulation      Electrical Stimulation   Electrical Stimulation Location  R knee    Electrical Stimulation Action  IFC    Electrical Stimulation Parameters  80-150 hz x15     Electrical Stimulation Goals  Pain;Edema      Vasopneumatic   Number Minutes Vasopneumatic   15 minutes    Vasopnuematic Location   Knee    Vasopneumatic Pressure  Medium    Vasopneumatic Temperature   69      Manual Therapy   Manual Therapy  Passive ROM;Soft tissue mobilization    Soft tissue mobilization  R patella mobilizations in all directions to reduce adhesions and promote proper mobility    Passive ROM  PROM of R knee into flexion/ext with holds at end range in sitting. PNF contract relax to improve flexion               PT Short Term Goals - 11/07/18 1535      PT SHORT TERM GOAL #1   Title  Patient will be independent with initial HEP    Time  2    Period  Weeks    Status  Achieved      PT SHORT TERM GOAL #2   Title  Patient will  demonstrate 90 degrees of right knee flexion PROM to improve ROM.    Time  2    Period  Weeks    Status  Achieved      PT SHORT TERM GOAL #3   Title  Patient will demonstrate 5 degrees or less of right knee extension ROM to improve ROM    Time  2    Period  Weeks    Status  On-going        PT Long Term Goals - 11/14/18 1450      PT LONG TERM GOAL #1   Title  Patient will be independent with advanced HEP    Time  4    Period  Weeks    Status  Achieved      PT LONG TERM GOAL #2   Title  Patient will demonstrate 120+ degrees of right knee flexion AROM to improve ability to perform functional tasks.    Time  4    Period  Weeks    Status  On-going   96 AROM, 106 PROM     PT LONG TERM GOAL #3   Title  Patient will demonstrate 3 degrees or less of right knee extension AROM to improve gait mechanics.    Time  4    Period  Weeks    Status  On-going      PT LONG TERM GOAL #4   Title  Patient wil demonstrate 4/5 or greater of right knee MMT in all planes to improve stability during functional tasks.    Time  4    Period  Weeks    Status  Achieved      PT LONG TERM GOAL #5   Title  Patient will report ability to perform ADLs with right knee pain less than or equal to 3/10.    Time  4    Period  Weeks    Status  Achieved            Plan - 11/14/18 1509    Clinical Impression Statement  Patient was able to tolerate treatment well with no reports of increased pain.  Patient was able to tolerate PNF technique to improve flexion and extension ROM. Strength goal has been met but still is limited with ROM. Patient's flexion end feels feels firm and still has some give. No adverse affects noted upon removal of modalities.     Examination-Activity Limitations  Bathing;Stand;Dressing;Stairs;Sleep    Stability/Clinical Decision Making  Stable/Uncomplicated    Clinical Decision Making  Low    Rehab Potential  Good    PT Frequency  3x / week    PT Duration  4 weeks    PT  Treatment/Interventions  ADLs/Self Care Home Management;Electrical Stimulation;Cryotherapy;Moist Heat;Gait training;Stair training;Functional mobility training;Manual techniques;Neuromuscular re-education;Balance training;Therapeutic exercise;Therapeutic activities;Patient/family education;Taping;Vasopneumatic Device;Passive range of motion    PT Next Visit Plan  Nustep, progressive AROM and PROM, patella mobilization, modalities PRN for pain relief.    Consulted and Agree with Plan of Care  Patient       Patient will benefit from skilled therapeutic intervention in order to improve the following deficits and impairments:  Pain, Decreased activity tolerance, Decreased range of motion, Decreased strength, Difficulty walking, Increased edema  Visit Diagnosis: Acute pain of right knee  Stiffness of right knee, not elsewhere classified  Difficulty in walking, not elsewhere classified     Problem List Patient Active Problem List   Diagnosis Date Noted  . OA (osteoarthritis) of knee 08/14/2018  . Osteoarthritis of right knee 08/14/2018   Gabriela Eves, PT, DPT 11/14/2018, 3:21 PM  [facility] [place], [place], [address] Phone: [phone]   Fax:  [phone]  Name: Patricia Thompson MRN: [id number] Date of Birth: Jan 28, 1966

## 2018-11-15 ENCOUNTER — Encounter: Payer: Self-pay | Admitting: Physical Therapy

## 2018-11-15 ENCOUNTER — Ambulatory Visit: Payer: BC Managed Care – PPO | Admitting: Physical Therapy

## 2018-11-15 ENCOUNTER — Other Ambulatory Visit: Payer: Self-pay

## 2018-11-15 DIAGNOSIS — M25561 Pain in right knee: Secondary | ICD-10-CM | POA: Diagnosis not present

## 2018-11-15 DIAGNOSIS — M25661 Stiffness of right knee, not elsewhere classified: Secondary | ICD-10-CM

## 2018-11-15 DIAGNOSIS — R262 Difficulty in walking, not elsewhere classified: Secondary | ICD-10-CM

## 2018-11-15 NOTE — Therapy (Signed)
[facility] [address] [place], [place], [address] Phone: [phone]   Fax:  [phone]  Physical Therapy Treatment  Patient Details  Name: Patricia Thompson Bazile MRN: [id number] Date of Birth: 04/23/1966 Referring Provider (PT): Ollen GrossFrank Aluisio, MD   Encounter Date: 11/15/2018  PT End of Session - 11/15/18 1429    Visit Number  11    Number of Visits  18    Date for PT Re-Evaluation  12/29/18    Authorization Type  Progress note every 10th visit    Authorization Time Period  5 visits remaining 11/15/2018    PT Start Time  1431   delayed due to being on the phone with insurance   PT Stop Time  1501    PT Time Calculation (min)  30 min    Activity Tolerance  Patient tolerated treatment well    Behavior During Therapy  [facility] for tasks assessed/performed       Past Medical History:  Diagnosis Date  . GERD (gastroesophageal reflux disease)    occasional  . OA (osteoarthritis)    knees    Past Surgical History:  Procedure Laterality Date  . COLONOSCOPY  06/2018  . DILATION AND CURETTAGE OF UTERUS  age 53  . TONSILLECTOMY  age 89  . 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  Subjective Assessment - 11/15/18 1424    Subjective  COVID 19 screening performed on patient upon arrival. Patient reports going to the doctor tomorrow.     Pertinent History  right TKA 08/14/2018    Limitations  Standing;Walking;Sitting;House hold activities    Diagnostic tests  x-ray    Patient Stated Goals  walk better, move better    Currently in Pain?  No/denies         [facility] - 11/15/18 0001      Assessment   Medical Diagnosis  unilateral primary osteoarthritis, right knee    Referring Provider (PT)  Ollen GrossFrank Aluisio, MD    Onset Date/Surgical Date  08/14/18    Next MD Visit  11/16/2018    Prior Therapy  no      Precautions   Precautions  Other (comment)    Precaution Comments  No Ultrasound                   [facility] Adult PT Treatment/Exercise - 11/15/18 0001      Knee/Hip Exercises: Aerobic   Nustep  L5, seat 5 x5 min      Knee/Hip Exercises: Supine   Heel Prop for Knee Extension  5 minutes;Weight    Heel Prop for Knee Extension Weight (lbs)  5      Modalities   Modalities  --      Manual Therapy   Manual Therapy  Passive ROM;Soft tissue mobilization    Soft tissue mobilization  R patella mobilizations in all directions to reduce adhesions and promote proper mobility    Passive ROM  PROM of R knee into flexion/ext with holds at end range in sitting. PNF contract relax to improve flexion; Passive R knee hamstring stretch               PT Short Term Goals - 11/07/18 1535      PT SHORT TERM GOAL #1   Title  Patient will  be independent with initial HEP    Time  2    Period  Weeks    Status  Achieved      PT SHORT TERM GOAL #2   Title  Patient will demonstrate 90 degrees of right knee flexion PROM to improve ROM.    Time  2    Period  Weeks    Status  Achieved      PT SHORT TERM GOAL #3   Title  Patient will demonstrate 5 degrees or less of right knee extension ROM to improve ROM    Time  2    Period  Weeks    Status  On-going        PT Long Term Goals - 11/14/18 1450      PT LONG TERM GOAL #1   Title  Patient will be independent with advanced HEP    Time  4    Period  Weeks    Status  Achieved      PT LONG TERM GOAL #2   Title  Patient will demonstrate 120+ degrees of right knee flexion AROM to improve ability to perform functional tasks.    Time  4    Period  Weeks    Status  On-going   96 AROM, 106 PROM     PT LONG TERM GOAL #3   Title  Patient will demonstrate 3 degrees or less of right knee extension AROM to improve gait mechanics.    Time  4    Period  Weeks    Status  On-going       PT LONG TERM GOAL #4   Title  Patient wil demonstrate 4/5 or greater of right knee MMT in all planes to improve stability during functional tasks.    Time  4    Period  Weeks    Status  Achieved      PT LONG TERM GOAL #5   Title  Patient will report ability to perform ADLs with right knee pain less than or equal to 3/10.    Time  4    Period  Weeks    Status  Achieved            Plan - 11/15/18 1424    Clinical Impression Statement  Patient's treatment was delayed secondary to calling the insurance company to confirm the number of visits available left in her service year. Patient denied pain with exercises and opted out of modalities to emphasize PROM. Patient noted with pain with patella joint mobilization especially with medial/lateral mobilizations. Patient has 5 visits remaining of 30 visits per service year. Patient discussed to continue 2x per week for 1 week then 1x per week for the remaining visits. Patient reported agreeement.     Examination-Activity Limitations  Bathing;Stand;Dressing;Stairs;Sleep    Stability/Clinical Decision Making  Stable/Uncomplicated    Clinical Decision Making  Low    Rehab Potential  Good    PT Frequency  3x / week    PT Duration  4 weeks    PT Treatment/Interventions  ADLs/Self Care Home Management;Electrical Stimulation;Cryotherapy;Moist Heat;Gait training;Stair training;Functional mobility training;Manual techniques;Neuromuscular re-education;Balance training;Therapeutic exercise;Therapeutic activities;Patient/family education;Taping;Vasopneumatic Device;Passive range of motion    PT Next Visit Plan  Nustep, progressive AROM and PROM, patella mobilization, modalities PRN for pain relief.    Consulted and Agree with Plan of Care  Patient       Patient will benefit from skilled therapeutic intervention in order to improve the following deficits and impairments:  Pain, Decreased activity tolerance, Decreased range of motion, Decreased strength,  Difficulty walking, Increased edema  Visit Diagnosis: Acute pain of right knee  Stiffness of right knee, not elsewhere classified  Difficulty in walking, not elsewhere classified     Problem List Patient Active Problem List   Diagnosis Date Noted  . OA (osteoarthritis) of knee 08/14/2018  . Osteoarthritis of right knee 08/14/2018   Guss BundeKrystle Mangawang, PT, DPT 11/15/2018, 3:08 PM  [facility] [address] [place], [place], [address] Phone: [phone]   Fax:  [phone]  Name: Patricia Thompson Coonan MRN: [id number] Date of Birth: 02/02/1966

## 2018-11-16 ENCOUNTER — Encounter: Payer: BC Managed Care – PPO | Admitting: Physical Therapy

## 2018-11-21 ENCOUNTER — Other Ambulatory Visit: Payer: Self-pay

## 2018-11-21 ENCOUNTER — Ambulatory Visit: Payer: BC Managed Care – PPO | Admitting: Physical Therapy

## 2018-11-21 DIAGNOSIS — M25561 Pain in right knee: Secondary | ICD-10-CM

## 2018-11-21 DIAGNOSIS — R262 Difficulty in walking, not elsewhere classified: Secondary | ICD-10-CM

## 2018-11-21 DIAGNOSIS — M25661 Stiffness of right knee, not elsewhere classified: Secondary | ICD-10-CM

## 2018-11-21 NOTE — Therapy (Signed)
[facility] [address] [place], [place], [address] Phone: [phone]   Fax:  [phone]  Physical Therapy Treatment  Patient Details  Name: Patricia Thompson MRN: [id number] Date of Birth: 10/30/1965 Referring Provider (PT): Ollen GrossFrank Aluisio, MD   Encounter Date: 11/21/2018  PT End of Session - 11/21/18 1425    Visit Number  12    Number of Visits  18    Date for PT Re-Evaluation  12/29/18    Authorization Type  Progress note every 10th visit    Authorization Time Period  4 visits remaining 11/21/2018    Authorization - Visit Number  --    Authorization - Number of Visits  --    PT Start Time  1400    PT Stop Time  1500    PT Time Calculation (min)  60 min    Activity Tolerance  Patient tolerated treatment well    Behavior During Therapy  [facility] for tasks assessed/performed       Past Medical History:  Diagnosis Date  . GERD (gastroesophageal reflux disease)    occasional  . OA (osteoarthritis)    knees    Past Surgical History:  Procedure Laterality Date  . COLONOSCOPY  06/2018  . DILATION AND CURETTAGE OF UTERUS  age 53  . TONSILLECTOMY  age 89  . 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  Subjective Assessment - 11/21/18 1404    Subjective  COVID 19 screening performed on patient upon arrival. Patient reported having a good report after her follow-up visit. States she has a tingling sensation on the lateral aspect of her knee.    Pertinent History  right TKA 08/14/2018    Limitations  Standing;Walking;Sitting;House hold activities    Diagnostic tests  x-ray    Patient Stated Goals  walk better, move better    Currently in Pain?  No/denies         [facility] PT Assessment - 11/21/18 0001      Assessment   Medical Diagnosis  unilateral primary  osteoarthritis, right knee    Referring Provider (PT)  Ollen GrossFrank Aluisio, MD    Onset Date/Surgical Date  08/14/18    Next MD Visit  September 2020    Prior Therapy  no      Precautions   Precautions  Other (comment)    Precaution Comments  No Ultrasound                   [facility] Adult PT Treatment/Exercise - 11/21/18 0001      Exercises   Exercises  Knee/Hip      Knee/Hip Exercises: Stretches   Knee: Self-Stretch to increase Flexion  2 reps;30 seconds    Knee: Self-Stretch Limitations  on leg press      Knee/Hip Exercises: Aerobic   Nustep  L5, seat 5 x13 min      Knee/Hip Exercises: Machines for Strengthening   Cybex Knee Extension  10# x2 minutes    Cybex Knee Flexion  20# x 2 minutes    Cybex Leg Press  2 plates x2 minutes followed by single leg press 2 plates      Knee/Hip Exercises: Standing   Lateral Step Up  Right;3 sets;10 reps;Hand Hold: 2;Step Height: 4"    Forward Step Up  Right;3 sets;Hand  Hold: 2;Step Height: 4"    Step Down  Right;3 sets;10 reps;Hand Hold: 2;Step Height: 4"      Acupuncturist Location  R knee    Electrical Stimulation Action  IFC    Electrical Stimulation Parameters  80-150 hz x15 mins    Electrical Stimulation Goals  Pain;Edema      Vasopneumatic   Number Minutes Vasopneumatic   15 minutes    Vasopnuematic Location   Knee    Vasopneumatic Pressure  Medium    Vasopneumatic Temperature   54      Manual Therapy   Manual Therapy  Passive ROM;Soft tissue mobilization    Soft tissue mobilization  R patella mobilizations in all directions to reduce adhesions and promote proper mobility    Passive ROM  PROM of R knee into ext with holds at end range in supine               PT Short Term Goals - 11/07/18 1535      PT SHORT TERM GOAL #1   Title  Patient will be independent with initial HEP    Time  2    Period  Weeks    Status  Achieved      PT SHORT TERM GOAL #2   Title  Patient will  demonstrate 90 degrees of right knee flexion PROM to improve ROM.    Time  2    Period  Weeks    Status  Achieved      PT SHORT TERM GOAL #3   Title  Patient will demonstrate 5 degrees or less of right knee extension ROM to improve ROM    Time  2    Period  Weeks    Status  On-going        PT Long Term Goals - 11/14/18 1450      PT LONG TERM GOAL #1   Title  Patient will be independent with advanced HEP    Time  4    Period  Weeks    Status  Achieved      PT LONG TERM GOAL #2   Title  Patient will demonstrate 120+ degrees of right knee flexion AROM to improve ability to perform functional tasks.    Time  4    Period  Weeks    Status  On-going   96 AROM, 106 PROM     PT LONG TERM GOAL #3   Title  Patient will demonstrate 3 degrees or less of right knee extension AROM to improve gait mechanics.    Time  4    Period  Weeks    Status  On-going      PT LONG TERM GOAL #4   Title  Patient wil demonstrate 4/5 or greater of right knee MMT in all planes to improve stability during functional tasks.    Time  4    Period  Weeks    Status  Achieved      PT LONG TERM GOAL #5   Title  Patient will report ability to perform ADLs with right knee pain less than or equal to 3/10.    Time  4    Period  Weeks    Status  Achieved            Plan - 11/21/18 1442    Clinical Impression Statement  Patient was able to tolerate the progression of strengthening exercises with just reports of stretching and tightness. Patient  required intermittent cuing to prevent hip external rotation during step downs. Patient noted with pain with patella mobilization but was able to tolerate knee extension PROM with minimal pain. No adverse affects upon removal of modalities.    Examination-Activity Limitations  Bathing;Stand;Dressing;Stairs;Sleep    Stability/Clinical Decision Making  Stable/Uncomplicated    Clinical Decision Making  Low    Rehab Potential  Good    PT Frequency  3x / week    PT  Duration  4 weeks    PT Treatment/Interventions  ADLs/Self Care Home Management;Electrical Stimulation;Cryotherapy;Moist Heat;Gait training;Stair training;Functional mobility training;Manual techniques;Neuromuscular re-education;Balance training;Therapeutic exercise;Therapeutic activities;Patient/family education;Taping;Vasopneumatic Device;Passive range of motion    PT Next Visit Plan  Nustep, progressive AROM and PROM, patella mobilization, modalities PRN for pain relief.    Consulted and Agree with Plan of Care  Patient       Patient will benefit from skilled therapeutic intervention in order to improve the following deficits and impairments:  Pain, Decreased activity tolerance, Decreased range of motion, Decreased strength, Difficulty walking, Increased edema  Visit Diagnosis: 1. Acute pain of right knee   2. Stiffness of right knee, not elsewhere classified   3. Difficulty in walking, not elsewhere classified        Problem List Patient Active Problem List   Diagnosis Date Noted  . OA (osteoarthritis) of knee 08/14/2018  . Osteoarthritis of right knee 08/14/2018   Guss BundeKrystle Mangawang, PT, DPT 11/21/2018, 3:26 PM  [facility] [address] [place], [place], [address] Phone: [phone]   Fax:  [phone]  Name: Patricia Thompson MRN: [id number] Date of Birth: 05/08/1966

## 2018-11-23 ENCOUNTER — Encounter: Payer: Self-pay | Admitting: Physical Therapy

## 2018-11-23 ENCOUNTER — Other Ambulatory Visit: Payer: Self-pay

## 2018-11-23 ENCOUNTER — Ambulatory Visit: Payer: BC Managed Care – PPO | Admitting: Physical Therapy

## 2018-11-23 DIAGNOSIS — R262 Difficulty in walking, not elsewhere classified: Secondary | ICD-10-CM

## 2018-11-23 DIAGNOSIS — M25661 Stiffness of right knee, not elsewhere classified: Secondary | ICD-10-CM

## 2018-11-23 DIAGNOSIS — M25561 Pain in right knee: Secondary | ICD-10-CM | POA: Diagnosis not present

## 2018-11-23 NOTE — Therapy (Signed)
[facility] [address] [place], [place], [address] Phone: [phone]   Fax:  [phone]  Physical Therapy Treatment  Patient Details  Name: Patricia Thompson MRN: [id number] Date of Birth: 07/01/1965 Referring Provider (PT): Ollen GrossFrank Aluisio, MD   Encounter Date: 11/23/2018 f PT End of Session - 11/23/18 1444    Visit Number  13    Number of Visits  18    Date for PT Re-Evaluation  12/29/18    Authorization Type  Progress note every 10th visit    Authorization Time Period  3 visits remaining 11/23/2018    PT Start Time  1400    Activity Tolerance  Patient tolerated treatment well    Behavior During Therapy  [facility] for tasks assessed/performed       Past Medical History:  Diagnosis Date  . GERD (gastroesophageal reflux disease)    occasional  . OA (osteoarthritis)    knees    Past Surgical History:  Procedure Laterality Date  . COLONOSCOPY  06/2018  . DILATION AND CURETTAGE OF UTERUS  age 53  . TONSILLECTOMY  age 89  . 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  Subjective Assessment - 11/23/18 1410    Subjective  COVID 19 screening performed on patient upon arrival. Patient reported no new complaints and no pain at the moment.    Pertinent History  right TKA 08/14/2018    Limitations  Standing;Walking;Sitting;House hold activities    Diagnostic tests  x-ray    Patient Stated Goals  walk better, move better    Currently in Pain?  No/denies         [facility] - 11/23/18 0001      Assessment   Medical Diagnosis  unilateral primary osteoarthritis, right knee    Referring Provider (PT)  Ollen GrossFrank Aluisio, MD    Onset Date/Surgical Date  08/14/18    Next MD Visit  September 2020    Prior Therapy  no                   [facility] Adult  PT Treatment/Exercise - 11/23/18 0001      Exercises   Exercises  Knee/Hip      Knee/Hip Exercises: Stretches   Knee: Self-Stretch to increase Flexion  2 reps;60 seconds    Knee: Self-Stretch Limitations  on leg press seat 3      Knee/Hip Exercises: Aerobic   Nustep  L5, seat 6 to 3 x13 min      Knee/Hip Exercises: Machines for Strengthening   Cybex Knee Extension  20# x2 minutes    Cybex Knee Flexion  30# x 2 minutes    Cybex Leg Press  3 plates x2 minutes       Knee/Hip Exercises: Supine   Heel Prop for Knee Extension  5 minutes;Weight    Heel Prop for Knee Extension Weight (lbs)  7.5#      Electrical Stimulation   Electrical Stimulation Location  R knee    Electrical Stimulation Action  IFC    Electrical Stimulation Parameters  80-150 hz x15 mins    Electrical Stimulation Goals  Pain;Edema      Vasopneumatic   Number Minutes Vasopneumatic   15 minutes    Vasopnuematic Location   Knee    Vasopneumatic Pressure  Medium    Vasopneumatic Temperature   34               PT Short Term Goals - 11/07/18 1535      PT SHORT TERM GOAL #1   Title  Patient will be independent with initial HEP    Time  2    Period  Weeks    Status  Achieved      PT SHORT TERM GOAL #2   Title  Patient will demonstrate 90 degrees of right knee flexion PROM to improve ROM.    Time  2    Period  Weeks    Status  Achieved      PT SHORT TERM GOAL #3   Title  Patient will demonstrate 5 degrees or less of right knee extension ROM to improve ROM    Time  2    Period  Weeks    Status  On-going        PT Long Term Goals - 11/14/18 1450      PT LONG TERM GOAL #1   Title  Patient will be independent with advanced HEP    Time  4    Period  Weeks    Status  Achieved      PT LONG TERM GOAL #2   Title  Patient will demonstrate 120+ degrees of right knee flexion AROM to improve ability to perform functional tasks.    Time  4    Period  Weeks    Status  On-going   96 AROM, 106 PROM      PT LONG TERM GOAL #3   Title  Patient will demonstrate 3 degrees or less of right knee extension AROM to improve gait mechanics.    Time  4    Period  Weeks    Status  On-going      PT LONG TERM GOAL #4   Title  Patient wil demonstrate 4/5 or greater of right knee MMT in all planes to improve stability during functional tasks.    Time  4    Period  Weeks    Status  Achieved      PT LONG TERM GOAL #5   Title  Patient will report ability to perform ADLs with right knee pain less than or equal to 3/10.    Time  4    Period  Weeks    Status  Achieved            Plan - 11/23/18 1447    Clinical Impression Statement  Patient was able to tolerate treatment well and was able to progress treatment without pain. Patient demonstrated good form with all exercises. Patient was able to tolerate increased time with the self knee flexion stretch on the leg press. Normal response to modalities upon removal.    Examination-Activity Limitations  Bathing;Stand;Dressing;Stairs;Sleep    Stability/Clinical Decision Making  Stable/Uncomplicated    Clinical Decision Making  Low    Rehab Potential  Good    PT Frequency  3x / week    PT Duration  4 weeks    PT Treatment/Interventions  ADLs/Self Care Home Management;Electrical Stimulation;Cryotherapy;Moist Heat;Gait training;Stair training;Functional mobility training;Manual techniques;Neuromuscular re-education;Balance training;Therapeutic exercise;Therapeutic activities;Patient/family education;Taping;Vasopneumatic Device;Passive range of motion    PT Next Visit Plan  Nustep, progressive AROM and PROM, patella mobilization, modalities PRN for pain relief.    Consulted and Agree with Plan of Care  Patient       Patient will benefit from skilled  therapeutic intervention in order to improve the following deficits and impairments:  Pain, Decreased activity tolerance, Decreased range of motion, Decreased strength, Difficulty walking, Increased  edema  Visit Diagnosis: 1. Acute pain of right knee   2. Stiffness of right knee, not elsewhere classified   3. Difficulty in walking, not elsewhere classified        Problem List Patient Active Problem List   Diagnosis Date Noted  . OA (osteoarthritis) of knee 08/14/2018  . Osteoarthritis of right knee 08/14/2018   Guss BundeKrystle Mangawang, PT, DPT 11/23/2018, 3:11 PM  [facility] [address] [place], [place], [address] Phone: [phone]   Fax:  [phone]  Name: Patricia Thompson MRN: [id number] Date of Birth: 06/18/1965

## 2018-11-28 ENCOUNTER — Encounter: Payer: Self-pay | Admitting: Physical Therapy

## 2018-11-28 ENCOUNTER — Other Ambulatory Visit: Payer: Self-pay

## 2018-11-28 ENCOUNTER — Ambulatory Visit: Payer: BC Managed Care – PPO | Admitting: Physical Therapy

## 2018-11-28 DIAGNOSIS — M25561 Pain in right knee: Secondary | ICD-10-CM

## 2018-11-28 DIAGNOSIS — R262 Difficulty in walking, not elsewhere classified: Secondary | ICD-10-CM

## 2018-11-28 DIAGNOSIS — M25661 Stiffness of right knee, not elsewhere classified: Secondary | ICD-10-CM

## 2018-11-28 NOTE — Therapy (Signed)
[facility] [place], [place], [address] Phone: [phone]   Fax:  [phone]  Physical Therapy Treatment  Patient Details  Name: Patricia Thompson MRN: [id number] Date of Birth: March 03, 1966 Referring Provider (PT): Gaynelle Arabian, MD   Encounter Date: 11/28/2018  PT End of Session - 11/28/18 1606    Visit Number  14    Number of Visits  18    Date for PT Re-Evaluation  12/29/18    Authorization Type  Progress note every 10th visit    Authorization Time Period  2 visits remaining 11/28/2018    PT Start Time  1500    PT Stop Time  1600    PT Time Calculation (min)  60 min    Activity Tolerance  Patient tolerated treatment well    Behavior During Therapy  [facility] for tasks assessed/performed       Past Medical History:  Diagnosis Date  . GERD (gastroesophageal reflux disease)    occasional  . OA (osteoarthritis)    knees    Past Surgical History:  Procedure Laterality Date  . COLONOSCOPY  06/2018  . DILATION AND CURETTAGE OF UTERUS  age 5  . TONSILLECTOMY  age 57  . TOTAL KNEE ARTHROPLASTY Right 08/14/2018   Procedure: TOTAL KNEE ARTHROPLASTY;  Surgeon: Gaynelle Arabian, MD;  Location: [facility];  Service: Orthopedics;  Laterality: Right;  43min    There were no vitals filed for this visit.  Subjective Assessment - 11/28/18 1546    Subjective  COVID 19 screening performed on patient upon arrival. Patient reported feeling sore in her knee and stated her knee.    Pertinent History  right TKA 08/14/2018    Limitations  Standing;Walking;Sitting;House hold activities    Diagnostic tests  x-ray    Patient Stated Goals  walk better, move better    Currently in Pain?  No/denies    Pain Onset  1 to 4 weeks ago    Pain Frequency  Constant         [facility] PT Assessment - 11/28/18 0001      Assessment   Medical Diagnosis  unilateral primary osteoarthritis, right knee    Referring Provider (PT)  Gaynelle Arabian, MD    Onset  Date/Surgical Date  08/14/18    Next MD Visit  September 2020    Prior Therapy  no                   [facility] Adult PT Treatment/Exercise - 11/28/18 0001      Exercises   Exercises  Knee/Hip      Knee/Hip Exercises: Aerobic   Nustep  L5, seat 5 to 2 x13 min      Knee/Hip Exercises: Machines for Strengthening   Cybex Knee Extension  20# x2 minutes    Cybex Knee Flexion  30# x 2 minutes    Cybex Leg Press  3 plates x2 minutes       Electrical Stimulation   Electrical Stimulation Location  R knee    Electrical Stimulation Action  IFC    Electrical Stimulation Parameters  80-150 hz x15 mins    Electrical Stimulation Goals  Pain;Edema      Vasopneumatic   Number Minutes Vasopneumatic   15 minutes    Vasopnuematic Location   Knee    Vasopneumatic Pressure  Medium    Vasopneumatic Temperature   34      Manual Therapy   Manual Therapy  Soft tissue mobilization  Soft tissue mobilization  STW/M to right knee with scar massaging to reduce pain and reduce scar tissu               PT Short Term Goals - 11/07/18 1535      PT SHORT TERM GOAL #1   Title  Patient will be independent with initial HEP    Time  2    Period  Weeks    Status  Achieved      PT SHORT TERM GOAL #2   Title  Patient will demonstrate 90 degrees of right knee flexion PROM to improve ROM.    Time  2    Period  Weeks    Status  Achieved      PT SHORT TERM GOAL #3   Title  Patient will demonstrate 5 degrees or less of right knee extension ROM to improve ROM    Time  2    Period  Weeks    Status  On-going        PT Long Term Goals - 11/14/18 1450      PT LONG TERM GOAL #1   Title  Patient will be independent with advanced HEP    Time  4    Period  Weeks    Status  Achieved      PT LONG TERM GOAL #2   Title  Patient will demonstrate 120+ degrees of right knee flexion AROM to improve ability to perform functional tasks.    Time  4    Period  Weeks    Status  On-going   96 AROM,  106 PROM     PT LONG TERM GOAL #3   Title  Patient will demonstrate 3 degrees or less of right knee extension AROM to improve gait mechanics.    Time  4    Period  Weeks    Status  On-going      PT LONG TERM GOAL #4   Title  Patient wil demonstrate 4/5 or greater of right knee MMT in all planes to improve stability during functional tasks.    Time  4    Period  Weeks    Status  Achieved      PT LONG TERM GOAL #5   Title  Patient will report ability to perform ADLs with right knee pain less than or equal to 3/10.    Time  4    Period  Weeks    Status  Achieved            Plan - 11/28/18 1607    Clinical Impression Statement  Patient tolerated treatment well but did note hypersensitivity to right knee with light touch. Patient was able to complete exercises with no increase of pain. Patient educated hypersensitvity may be due to the nerves regenerating and it is a normal process of healing. Patient reported understanding. Normal response to modalities upon removal.    Examination-Activity Limitations  Bathing;Stand;Dressing;Stairs;Sleep    Stability/Clinical Decision Making  Stable/Uncomplicated    Clinical Decision Making  Low    Rehab Potential  Good    PT Frequency  3x / week    PT Duration  4 weeks    PT Treatment/Interventions  ADLs/Self Care Home Management;Electrical Stimulation;Cryotherapy;Moist Heat;Gait training;Stair training;Functional mobility training;Manual techniques;Neuromuscular re-education;Balance training;Therapeutic exercise;Therapeutic activities;Patient/family education;Taping;Vasopneumatic Device;Passive range of motion    PT Next Visit Plan  Nustep, progressive AROM and PROM, patella mobilization, modalities PRN for pain relief.    PT Home Exercise Plan  see patient education section    Consulted and Agree with Plan of Care  Patient       Patient will benefit from skilled therapeutic intervention in order to improve the following deficits and  impairments:  Pain, Decreased activity tolerance, Decreased range of motion, Decreased strength, Difficulty walking, Increased edema  Visit Diagnosis: 1. Acute pain of right knee   2. Stiffness of right knee, not elsewhere classified   3. Difficulty in walking, not elsewhere classified        Problem List Patient Active Problem List   Diagnosis Date Noted  . OA (osteoarthritis) of knee 08/14/2018  . Osteoarthritis of right knee 08/14/2018   Guss BundeKrystle Mangawang, PT, DPT 11/28/2018, 4:11 PM  [facility] [address] [place], [place], [address] Phone: [phone]   Fax:  [phone]  Name: Patricia Thompson MRN: [id number] Date of Birth: 02/14/1966

## 2018-12-07 ENCOUNTER — Ambulatory Visit: Payer: BC Managed Care – PPO | Attending: Orthopedic Surgery | Admitting: Physical Therapy

## 2018-12-07 ENCOUNTER — Other Ambulatory Visit: Payer: Self-pay

## 2018-12-07 ENCOUNTER — Encounter: Payer: Self-pay | Admitting: Physical Therapy

## 2018-12-07 DIAGNOSIS — R262 Difficulty in walking, not elsewhere classified: Secondary | ICD-10-CM

## 2018-12-07 DIAGNOSIS — M25561 Pain in right knee: Secondary | ICD-10-CM | POA: Insufficient documentation

## 2018-12-07 DIAGNOSIS — M25661 Stiffness of right knee, not elsewhere classified: Secondary | ICD-10-CM | POA: Diagnosis present

## 2018-12-07 NOTE — Therapy (Signed)
[facility] [place], [place], [address] Phone: [phone]   Fax:  [phone]  Physical Therapy Treatment PHYSICAL THERAPY DISCHARGE SUMMARY  Visits from Start of Care: 15  Current functional level related to goals / functional outcomes: See below   Remaining deficits: See goals   Education / Equipment: HEP  Plan: Patient agrees to discharge.  Patient goals were not met. Patient is being discharged due to                                                     ?????    Discharge due to completing all visits allotted by insurance. Gabriela Eves, PT, DPT  Patient Details  Name: LINDSAY STRAKA MRN: [id number] Date of Birth: 1965/11/27 Referring Provider (PT): Gaynelle Arabian, MD   Encounter Date: 12/07/2018  PT End of Session - 12/07/18 1510    Visit Number  15    Number of Visits  18    Date for PT Re-Evaluation  12/29/18    Authorization Type  Progress note every 10th visit    Authorization Time Period  1 visits remainin 12/07/2018    PT Start Time  1500    PT Stop Time  1556    PT Time Calculation (min)  56 min    Activity Tolerance  Patient tolerated treatment well    Behavior During Therapy  [facility] for tasks assessed/performed       Past Medical History:  Diagnosis Date  . GERD (gastroesophageal reflux disease)    occasional  . OA (osteoarthritis)    knees    Past Surgical History:  Procedure Laterality Date  . COLONOSCOPY  06/2018  . DILATION AND CURETTAGE OF UTERUS  age 6  . TONSILLECTOMY  age 34  . 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  Subjective Assessment - 12/07/18 1508    Subjective  COVID 19 screening performed on patient upon arrival. Patient reported feeling good.    Pertinent History  right TKA 08/14/2018     Limitations  Standing;Walking;Sitting;House hold activities    Diagnostic tests  x-ray    Patient Stated Goals  walk better, move better    Currently in Pain?  No/denies         [facility]      Assessment   Medical Diagnosis  unilateral primary osteoarthritis, right knee    Referring Provider (PT)  FGaynelle Arabian MD    Onset Date/Surgical Date  08/14/18    Next MD Visit  September 2020    Prior Therapy  no      AROM   Right Knee Extension  4    Right Knee Flexion  94      PROM   Right Knee Flexion  102                   [facility] Adult PT Treatment/Exercise - 12/07/18 0001      Exercises   Exercises  Knee/Hip      Knee/Hip Exercises: Aerobic   Nustep  L5, seat 5 to 2 x13 min      Knee/Hip Exercises:  Machines for Strengthening   Cybex Knee Extension  20# x2 minutes    Cybex Knee Flexion  30# x 2 minutes    Cybex Leg Press  3 plates x2 minutes       Electrical Stimulation   Electrical Stimulation Location  right knee    Electrical Stimulation Action  IFC    Electrical Stimulation Parameters  80-150 hz x10 mins    Electrical Stimulation Goals  Edema      Vasopneumatic   Number Minutes Vasopneumatic   10 minutes    Vasopnuematic Location   Knee    Vasopneumatic Pressure  Medium    Vasopneumatic Temperature   34               PT Short Term Goals - 11/07/18 1535      PT SHORT TERM GOAL #1   Title  Patient will be independent with initial HEP    Time  2    Period  Weeks    Status  Achieved      PT SHORT TERM GOAL #2   Title  Patient will demonstrate 90 degrees of right knee flexion PROM to improve ROM.    Time  2    Period  Weeks    Status  Achieved      PT SHORT TERM GOAL #3   Title  Patient will demonstrate 5 degrees or less of right knee extension ROM to improve ROM    Time  2    Period  Weeks    Status  On-going        PT Long Term Goals - 12/07/18 1608      PT LONG TERM GOAL #1   Title  Patient will  be independent with advanced HEP    Time  4    Period  Weeks    Status  Achieved      PT LONG TERM GOAL #2   Title  Patient will demonstrate 120+ degrees of right knee flexion AROM to improve ability to perform functional tasks.    Time  4    Period  Weeks    Status  Not Met      PT LONG TERM GOAL #3   Title  Patient will demonstrate 3 degrees or less of right knee extension AROM to improve gait mechanics.    Period  Weeks    Status  Not Met      PT LONG TERM GOAL #4   Title  Patient wil demonstrate 4/5 or greater of right knee MMT in all planes to improve stability during functional tasks.    Time  4    Period  Weeks    Status  Achieved      PT LONG TERM GOAL #5   Title  Patient will report ability to perform ADLs with right knee pain less than or equal to 3/10.    Time  4    Period  Weeks    Status  Achieved            Plan - 12/07/18 1601    Clinical Impression Statement  Patient arrived stating she is pleased with her knee but still has difficulties with step downs. Patient denied pain with exercises. Patient's goals were partially met. Patient educated on importance of continuing HEP to maximize ROM. Patient reported understanding. Patient discharged today due to competing all visits alloted by insurance.    Examination-Activity Limitations  Bathing;Stand;Dressing;Stairs;Sleep    Stability/Clinical Decision Making  Stable/Uncomplicated    Clinical Decision Making  Low    Rehab Potential  Good    PT Frequency  3x / week    PT Duration  4 weeks    PT Treatment/Interventions  ADLs/Self Care Home Management;Electrical Stimulation;Cryotherapy;Moist Heat;Gait training;Stair training;Functional mobility training;Manual techniques;Neuromuscular re-education;Balance training;Therapeutic exercise;Therapeutic activities;Patient/family education;Taping;Vasopneumatic Device;Passive range of motion    PT Next Visit Plan  DC    PT Home Exercise Plan  see patient education section     Consulted and Agree with Plan of Care  Patient       Patient will benefit from skilled therapeutic intervention in order to improve the following deficits and impairments:  Pain, Decreased activity tolerance, Decreased range of motion, Decreased strength, Difficulty walking, Increased edema  Visit Diagnosis: 1. Acute pain of right knee   2. Stiffness of right knee, not elsewhere classified   3. Difficulty in walking, not elsewhere classified        Problem List Patient Active Problem List   Diagnosis Date Noted  . OA (osteoarthritis) of knee 08/14/2018  . Osteoarthritis of right knee 08/14/2018    Gabriela Eves, PT, DPT 12/07/2018, 4:11 PM  [facility] [place], [place], [address] Phone: [phone]   Fax:  [phone]  Name: LIVIAN VANDERBECK MRN: [id number] Date of Birth: 01/10/1966

## 2019-10-03 ENCOUNTER — Ambulatory Visit: Payer: Self-pay | Attending: Internal Medicine

## 2019-10-03 DIAGNOSIS — Z23 Encounter for immunization: Secondary | ICD-10-CM

## 2019-10-03 NOTE — Progress Notes (Signed)
   Covid-19 Vaccination Clinic  Name:  Patricia Thompson    MRN: [id number] DOB: 05/31/1966  10/03/2019  [name] was observed post Covid-19 immunization for 15 minutes without incident. She was provided with Vaccine Information Sheet and instruction to access the V-Safe system.   [name] was instructed to call 911 with any severe reactions post vaccine: Marland Kitchen Difficulty breathing  . Swelling of face and throat  . A fast heartbeat  . A bad rash all over body  . Dizziness and weakness   Immunizations Administered    Name Date Dose VIS Date Route   Moderna COVID-19 Vaccine 10/03/2019 12:45 PM 0.5 mL 05/2019 Intramuscular   Manufacturer: Moderna   Lot: [id number]   NDC: 73344-830-15

## 2019-10-31 ENCOUNTER — Ambulatory Visit: Payer: Self-pay | Attending: Internal Medicine

## 2019-10-31 DIAGNOSIS — Z23 Encounter for immunization: Secondary | ICD-10-CM

## 2019-10-31 NOTE — Progress Notes (Signed)
   Covid-19 Vaccination Clinic  Name:  Patricia Thompson    MRN: [id number] DOB: 02-23-1966  10/31/2019  [name] was observed post Covid-19 immunization for 15 minutes without incident. She was provided with Vaccine Information Sheet and instruction to access the V-Safe system.   [name] was instructed to call 911 with any severe reactions post vaccine: Marland Kitchen Difficulty breathing  . Swelling of face and throat  . A fast heartbeat  . A bad rash all over body  . Dizziness and weakness   Immunizations Administered    Name Date Dose VIS Date Route   Moderna COVID-19 Vaccine 10/31/2019 10:18 AM 0.5 mL 05/2019 Intramuscular   Manufacturer: Moderna   Lot: [id number]   NDC: 38756-433-29

## 2020-01-28 ENCOUNTER — Other Ambulatory Visit: Payer: Self-pay | Admitting: Nurse Practitioner

## 2020-01-28 DIAGNOSIS — Z1231 Encounter for screening mammogram for malignant neoplasm of breast: Secondary | ICD-10-CM

## 2020-02-06 ENCOUNTER — Other Ambulatory Visit: Payer: Self-pay | Admitting: Nurse Practitioner

## 2020-02-06 DIAGNOSIS — Z1231 Encounter for screening mammogram for malignant neoplasm of breast: Secondary | ICD-10-CM

## 2020-02-08 ENCOUNTER — Ambulatory Visit
Admission: RE | Admit: 2020-02-08 | Discharge: 2020-02-08 | Disposition: A | Discharge status: Home / Self Care | Payer: Self-pay | Source: Ambulatory Visit | Attending: Nurse Practitioner | Admitting: Nurse Practitioner

## 2020-02-08 DIAGNOSIS — Z1231 Encounter for screening mammogram for malignant neoplasm of breast: Secondary | ICD-10-CM

## 2020-02-14 ENCOUNTER — Other Ambulatory Visit: Payer: Self-pay | Admitting: Nurse Practitioner

## 2020-02-14 DIAGNOSIS — R928 Other abnormal and inconclusive findings on diagnostic imaging of breast: Secondary | ICD-10-CM

## 2020-02-15 ENCOUNTER — Ambulatory Visit: Payer: Self-pay

## 2020-02-21 ENCOUNTER — Ambulatory Visit
Admission: RE | Admit: 2020-02-21 | Discharge: 2020-02-21 | Disposition: A | Discharge status: Home / Self Care | Payer: 59 | Source: Ambulatory Visit | Attending: Nurse Practitioner | Admitting: Nurse Practitioner

## 2020-02-21 ENCOUNTER — Other Ambulatory Visit: Payer: Self-pay

## 2020-02-21 ENCOUNTER — Other Ambulatory Visit: Payer: Self-pay | Admitting: Nurse Practitioner

## 2020-02-21 DIAGNOSIS — R928 Other abnormal and inconclusive findings on diagnostic imaging of breast: Secondary | ICD-10-CM

## 2020-05-23 ENCOUNTER — Ambulatory Visit
Admission: RE | Admit: 2020-05-23 | Discharge: 2020-05-23 | Disposition: A | Discharge status: Home / Self Care | Payer: 59 | Source: Ambulatory Visit | Attending: Nurse Practitioner | Admitting: Nurse Practitioner

## 2020-05-23 ENCOUNTER — Other Ambulatory Visit: Payer: Self-pay | Admitting: Nurse Practitioner

## 2020-05-23 ENCOUNTER — Other Ambulatory Visit: Payer: Self-pay

## 2020-05-23 DIAGNOSIS — R928 Other abnormal and inconclusive findings on diagnostic imaging of breast: Secondary | ICD-10-CM

## 2020-07-29 ENCOUNTER — Ambulatory Visit (INDEPENDENT_AMBULATORY_CARE_PROVIDER_SITE_OTHER): Payer: 59 | Admitting: Cardiology

## 2020-07-29 ENCOUNTER — Encounter: Payer: Self-pay | Admitting: Cardiology

## 2020-07-29 VITALS — BP 104/76 | HR 78 | Ht 60.0 in | Wt 228.0 lb

## 2020-07-29 DIAGNOSIS — R079 Chest pain, unspecified: Secondary | ICD-10-CM | POA: Diagnosis not present

## 2020-07-29 MED ORDER — METOPROLOL TARTRATE 100 MG PO TABS: ORAL_TABLET | ORAL | 0 refills | Status: DC

## 2020-07-29 NOTE — Patient Instructions (Addendum)
Your physician recommends that you schedule a follow-up appointment in: PENDING WITH DR [facility]  Your physician has recommended you make the following change in your medication:   START ASPIRIN 81 MG DAILY   Your cardiac CT will be scheduled at one of the below locations:   [facility] [address] [place], [place] [address] [phone]  If scheduled at [facility], please arrive at the [facility] main entrance (entrance A) of [facility] 30 minutes prior to test start time. Proceed to the [facility] Radiology Department (first floor) to check-in and test prep.  Please follow these instructions carefully (unless otherwise directed):  TAKE LOPRESSOR 100 MG 2 HOURS PRIOR TO TEST   PLEASE HAVE LABS DONE 1 WEEK PRIOR TO TEST   On the Night Before the Test: . Be sure to Drink plenty of water. . Do not consume any caffeinated/decaffeinated beverages or chocolate 12 hours prior to your test. . Do not take any antihistamines 12 hours prior to your test.  On the Day of the Test: . Drink plenty of water until 1 hour prior to the test. . Do not eat any food 4 hours prior to the test. . You may take your regular medications prior to the test.  . Take metoprolol (Lopressor) two hours prior to test. . HOLD Furosemide/Hydrochlorothiazide morning of the test. . FEMALES- please wear underwire-free bra if available       After the Test: . Drink plenty of water. . After receiving IV contrast, you may experience a mild flushed feeling. This is normal. . On occasion, you may experience a mild rash up to 24 hours after the test. This is not dangerous. If this occurs, you can take Benadryl 25 mg and increase your fluid intake. . If you experience trouble breathing, this can be serious. If it is severe call 911 IMMEDIATELY. If it is mild, please call our office. . If you take any of these medications: Glipizide/Metformin, Avandament, Glucavance, please do not take 48  hours after completing test unless otherwise instructed.   Once we have confirmed authorization from your insurance company, we will call you to set up a date and time for your test. Based on how quickly your insurance processes prior authorizations requests, please allow up to 4 weeks to be contacted for scheduling your Cardiac CT appointment. Be advised that routine Cardiac CT appointments could be scheduled as many as 8 weeks after your provider has ordered it.  For non-scheduling related questions, please contact the cardiac imaging nurse navigator should you have any questions/concerns: Rockwell Alexandria, Cardiac Imaging Nurse Navigator Larey Brick, Cardiac Imaging Nurse Navigator [facility] Direct Office Dial: [phone]   For scheduling needs, including cancellations and rescheduling, please call [place], [phone].

## 2020-07-29 NOTE — Progress Notes (Signed)
Clinical Summary Ms. Bown is a 55 y.o.female seen today as a new consult, referred by Dr Sherryll Burger for the following medical problems.    1. Chest pain - ER visit 05/30/2020 [facility] witih chest pain  - trop 42-->47-->78-->86 - EKG per report sinus brady, nonspec TWIs CT no PE, +pulm nodules  - was not able to be admitted for further evaluation due to outside commitments.    - 2 episodes since her ER visit, last episode 1 week ago - midchest/epistric. Aching like pain, can be sharp. 7-8/10 in severity. Can occur at rest or with exertion. Not positional. No other associated symptoms. Lasts about 15 minutes.  - denies any DOE   CAD risk factors: + tobacco x 37 years    2. Lung nodules - noted on CT PE, recs were for repeat scan in 3-6 months   Kate Sable is her sister, who is also a patient of mine Past Medical History:  Diagnosis Date  . GERD (gastroesophageal reflux disease)    occasional  . OA (osteoarthritis)    knees     Allergies  Allergen Reactions  . Vicodin [Hydrocodone-Acetaminophen] Nausea And Vomiting     Current Outpatient Medications  Medication Sig Dispense Refill  . Biotin 38756 MCG TABS Take 20,000 mcg by mouth daily.    Marland Kitchen gabapentin (NEURONTIN) 300 MG capsule Take 1 capsule (300 mg total) by mouth 3 (three) times daily. Take a 300 mg capsule three times a day for two weeks following surgery.Then take a 300 mg capsule two times a day for two weeks. Then take a 300 mg capsule once a day for two weeks. Then discontinue. 84 capsule 0  . methocarbamol (ROBAXIN) 500 MG tablet Take 1 tablet (500 mg total) by mouth every 6 (six) hours as needed for muscle spasms. 40 tablet 0  . omeprazole (PRILOSEC) 20 MG capsule Take 20 mg by mouth as needed.    Marland Kitchen OVER THE COUNTER MEDICATION Take 2 tablets by mouth daily. Keratin    . oxyCODONE (OXY IR/ROXICODONE) 5 MG immediate release tablet Take 1-2 tablets (5-10 mg total) by mouth every 6 (six) hours as  needed for severe pain. 56 tablet 0  . traMADol (ULTRAM) 50 MG tablet Take 1-2 tablets (50-100 mg total) by mouth every 6 (six) hours as needed for moderate pain. 40 tablet 0   No current facility-administered medications for this visit.     Past Surgical History:  Procedure Laterality Date  . COLONOSCOPY  06/2018  . DILATION AND CURETTAGE OF UTERUS  age 11  . TONSILLECTOMY  age 84  . TOTAL KNEE ARTHROPLASTY Right 08/14/2018   Procedure: TOTAL KNEE ARTHROPLASTY;  Surgeon: Ollen Gross, MD;  Location: [facility];  Service: Orthopedics;  Laterality: Right;      Allergies  Allergen Reactions  . Vicodin [Hydrocodone-Acetaminophen] Nausea And Vomiting      No family history on file.   Social History Ms. Dieu reports that she has been smoking cigarettes. She has a 8.75 pack-year smoking history. She has never used smokeless tobacco. Ms. Gorby reports no history of alcohol use.   Review of Systems CONSTITUTIONAL: No weight loss, fever, chills, weakness or fatigue.  HEENT: Eyes: No visual loss, blurred vision, double vision or yellow sclerae.No hearing loss, sneezing, congestion, runny nose or sore throat.  SKIN: No rash or itching.  CARDIOVASCULAR: per hpi RESPIRATORY: No shortness of breath, cough or sputum.  GASTROINTESTINAL: No anorexia, nausea, vomiting or diarrhea. No  abdominal pain or blood.  GENITOURINARY: No burning on urination, no polyuria NEUROLOGICAL: No headache, dizziness, syncope, paralysis, ataxia, numbness or tingling in the extremities. No change in bowel or bladder control.  MUSCULOSKELETAL: No muscle, back pain, joint pain or stiffness.  LYMPHATICS: No enlarged nodes. No history of splenectomy.  PSYCHIATRIC: No history of depression or anxiety.  ENDOCRINOLOGIC: No reports of sweating, cold or heat intolerance. No polyuria or polydipsia.  Marland Kitchen   Physical Examination Today's Vitals   07/29/20 1444  BP: 104/76  Pulse: 78  SpO2: 98%  Weight: 228 lb  (103.4 kg)  Height: 5' (1.524 m)   Body mass index is 44.53 kg/m.  Gen: resting comfortably, no acute distress HEENT: no scleral icterus, pupils equal round and reactive, no palptable cervical adenopathy,  CV: RRR, no m/rg, no jvd Resp: Clear to auscultation bilaterally GI: abdomen is soft, non-tender, non-distended, normal bowel sounds, no hepatosplenomegaly MSK: extremities are warm, no edema.  Skin: warm, no rash Neuro:  no focal deficits Psych: appropriate affect     Assessment and Plan  1. Chest pain - unclear etiology. Mild trop elevation during prior ER visit in December raises concern for possible ischemia. Long tobacco history - given body habitus best option would be coronary CTA, will arrange - start aspirin 81mg  daily until CT results are back      , M.D.

## 2020-08-05 ENCOUNTER — Telehealth (HOSPITAL_COMMUNITY): Payer: Self-pay | Admitting: Emergency Medicine

## 2020-08-05 NOTE — Telephone Encounter (Signed)
Reaching out to patient to offer assistance regarding upcoming cardiac imaging study; pt verbalizes understanding of appt date/time, parking situation and where to check in, pre-test NPO status and medications ordered, and verified current allergies; name and call back number provided for further questions should they arise Patricia Ard RN Navigator Cardiac Imaging [facility] [phone] office [phone] cell  100mg metoprolol tartrate 2 hr prior to scan Patricia Thompson  

## 2020-08-07 ENCOUNTER — Other Ambulatory Visit: Payer: Self-pay

## 2020-08-07 ENCOUNTER — Ambulatory Visit (HOSPITAL_COMMUNITY)
Admission: RE | Admit: 2020-08-07 | Discharge: 2020-08-07 | Disposition: A | Discharge status: Home / Self Care | Payer: 59 | Source: Ambulatory Visit | Attending: Cardiology | Admitting: Cardiology

## 2020-08-07 DIAGNOSIS — R079 Chest pain, unspecified: Secondary | ICD-10-CM

## 2020-08-07 DIAGNOSIS — Z006 Encounter for examination for normal comparison and control in clinical research program: Secondary | ICD-10-CM

## 2020-08-07 MED ORDER — NITROGLYCERIN 0.4 MG SL SUBL
0.8000 mg | SUBLINGUAL_TABLET | Freq: Once | SUBLINGUAL | Status: AC | PRN
  Administered 2020-08-07: 0.8 mg via SUBLINGUAL

## 2020-08-07 MED ORDER — NITROGLYCERIN 0.4 MG SL SUBL
SUBLINGUAL_TABLET | SUBLINGUAL | Status: AC
  Filled 2020-08-07: qty 2

## 2020-08-07 MED ORDER — IOHEXOL 350 MG/ML SOLN
80.0000 mL | Freq: Once | INTRAVENOUS | Status: AC | PRN
  Administered 2020-08-07: 80 mL via INTRAVENOUS

## 2020-08-07 NOTE — Progress Notes (Addendum)
Patient arrived for CT cardiac imaging. Patient reports that she is a difficult IV stick, when she has IV access at the hospital it usually requires ultrasound placement.   Order placed for IV Team consult for IV access.

## 2020-08-07 NOTE — Research (Signed)
IDENTIFY Informed Consent                  Subject Name: Patricia Thompson    Subject met inclusion and exclusion criteria.  The informed consent form, study requirements and expectations were reviewed with the subject and questions and concerns were addressed prior to the signing of the consent form.  The subject verbalized understanding of the trial requirements.  The subject agreed to participate in the IDENTIFY trial and signed the informed consent at 15:14PM on 08/07/20.  The informed consent was obtained prior to performance of any protocol-specific procedures for the subject.  A copy of the signed informed consent was given to the subject and a copy was placed in the subject's medical record.   Meade Maw, Naval architect

## 2020-08-07 NOTE — Progress Notes (Signed)
CT cardiac imaging completed. Provided with snack and blood pressure rechecked. PIV removed with tip intact. Ambulatory to hospital lobby, discharged in stable condition.

## 2020-08-13 ENCOUNTER — Telehealth: Payer: Self-pay | Admitting: *Deleted

## 2020-08-13 NOTE — Telephone Encounter (Signed)
-----   Message from Antoine Poche, MD sent at 08/11/2020  6:55 PM EST ----- Coronary CT scan looks good, only very mild plaque is seen without any significant blockages. Would continue aspirin given she has some early plaque.    Dominga Ferry MD

## 2020-08-13 NOTE — Telephone Encounter (Signed)
Pt voiced understanding

## 2020-09-10 ENCOUNTER — Telehealth: Payer: Self-pay | Admitting: Cardiology

## 2020-09-10 NOTE — Telephone Encounter (Signed)
Contacted patient with no answer. After looking at her chart, it appears patient had CTA in March of 2022.

## 2020-09-10 NOTE — Telephone Encounter (Signed)
New message    Patient returned call to [facility]

## 2020-09-10 NOTE — Telephone Encounter (Signed)
Patient is calling to see what the status is of Dr Wyline Mood scheduling her for CT on heart and polyps. She would like to have CT done in [place].Please call her back to let her know what is going on. She can be reached at [phone]

## 2020-09-10 NOTE — Telephone Encounter (Signed)
Left a message for a call back.

## 2020-09-10 NOTE — Telephone Encounter (Signed)
Patient had called earlier to ask if Dr. Wyline Mood had ordered a Lung CT when her ordered her CTA in February. I informed the patient that from the looks of notes/ phone calls, he did not and the best thing to do would be start at her PCP if she wanted a Lung CT to be performed.

## 2020-09-18 ENCOUNTER — Other Ambulatory Visit: Payer: Self-pay | Admitting: Nurse Practitioner

## 2020-09-18 DIAGNOSIS — R918 Other nonspecific abnormal finding of lung field: Secondary | ICD-10-CM

## 2020-09-24 ENCOUNTER — Ambulatory Visit (HOSPITAL_COMMUNITY)
Admission: RE | Admit: 2020-09-24 | Discharge: 2020-09-24 | Disposition: A | Discharge status: Home / Self Care | Payer: 59 | Source: Ambulatory Visit | Attending: Nurse Practitioner | Admitting: Nurse Practitioner

## 2020-09-24 ENCOUNTER — Other Ambulatory Visit: Payer: Self-pay

## 2020-09-24 DIAGNOSIS — R918 Other nonspecific abnormal finding of lung field: Secondary | ICD-10-CM | POA: Insufficient documentation

## 2020-12-23 ENCOUNTER — Telehealth: Payer: Self-pay

## 2020-12-23 DIAGNOSIS — Z006 Encounter for examination for normal comparison and control in clinical research program: Secondary | ICD-10-CM

## 2020-12-23 NOTE — Telephone Encounter (Signed)
I have attempted without success to contact this patient by phone for her Identify 90 day follow up phone call. I left a message for patient to return my phone call with my name and callback number. An e-mail was also sent to patient.  

## 2021-01-05 ENCOUNTER — Other Ambulatory Visit: Payer: Self-pay | Admitting: Internal Medicine

## 2021-01-05 DIAGNOSIS — Z1231 Encounter for screening mammogram for malignant neoplasm of breast: Secondary | ICD-10-CM

## 2021-02-27 ENCOUNTER — Other Ambulatory Visit: Payer: Self-pay

## 2021-02-27 ENCOUNTER — Ambulatory Visit
Admission: RE | Admit: 2021-02-27 | Discharge: 2021-02-27 | Disposition: A | Discharge status: Home / Self Care | Payer: 59 | Source: Ambulatory Visit | Attending: Internal Medicine | Admitting: Internal Medicine

## 2021-02-27 DIAGNOSIS — Z1231 Encounter for screening mammogram for malignant neoplasm of breast: Secondary | ICD-10-CM

## 2021-03-03 ENCOUNTER — Ambulatory Visit: Payer: 59 | Admitting: Cardiology

## 2021-03-18 ENCOUNTER — Encounter: Payer: Self-pay | Admitting: Cardiology

## 2021-03-24 ENCOUNTER — Encounter: Payer: Self-pay | Admitting: *Deleted

## 2021-03-25 ENCOUNTER — Ambulatory Visit (INDEPENDENT_AMBULATORY_CARE_PROVIDER_SITE_OTHER): Payer: 59 | Admitting: Cardiology

## 2021-03-25 ENCOUNTER — Encounter: Payer: Self-pay | Admitting: Cardiology

## 2021-03-25 ENCOUNTER — Encounter: Payer: Self-pay | Admitting: *Deleted

## 2021-03-25 VITALS — BP 124/70 | HR 69 | Ht 60.0 in | Wt 242.8 lb

## 2021-03-25 DIAGNOSIS — I2510 Atherosclerotic heart disease of native coronary artery without angina pectoris: Secondary | ICD-10-CM

## 2021-03-25 MED ORDER — ATORVASTATIN CALCIUM 20 MG PO TABS: 20.0000 mg | ORAL_TABLET | Freq: Every day | ORAL | 3 refills | Status: DC

## 2021-03-25 NOTE — Progress Notes (Signed)
Clinical Summary Patricia Thompson is a 55 y.o.female seen today for follow up of the following medical problems.    1. Chest pain - ER visit 05/30/2020 [facility] witih chest pain  - trop 42-->47-->78-->86 - EKG per report sinus brady, nonspec TWIs CT no PE, +pulm nodules   - was not able to be admitted for further evaluation due to outside commitments.   CAD risk factors: + tobacco x 37 years     08/2020 coronary CTA: small focal LAD 0-24% - some chest pains at times, typically with stress or being upset. Symptoms are not exertional     2. Lung nodules - noted on CT PE, recs were for repeat scan in 3-6 months  - followed by pcp    3. COVID 01/2021     Patricia Thompson is her sister, who is also a patient of mine   Past Medical History:  Diagnosis Date   GERD (gastroesophageal reflux disease)    occasional   OA (osteoarthritis)    knees     Allergies  Allergen Reactions   Vicodin [Hydrocodone-Acetaminophen] Nausea And Vomiting     Current Outpatient Medications  Medication Sig Dispense Refill   aspirin EC 81 MG tablet Take 81 mg by mouth daily. Swallow whole.     Biotin 84132 MCG TABS Take 20,000 mcg by mouth daily.     methocarbamol (ROBAXIN) 500 MG tablet Take 1 tablet (500 mg total) by mouth every 6 (six) hours as needed for muscle spasms. 40 tablet 0   metoprolol tartrate (LOPRESSOR) 100 MG tablet TAKE 1 TABLET 2 HOURS PRIOR TO TEST 1 tablet 0   omeprazole (PRILOSEC) 20 MG capsule Take 20 mg by mouth as needed.     OVER THE COUNTER MEDICATION Take 2 tablets by mouth daily. Keratin     No current facility-administered medications for this visit.     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 Spickard reports that she has been smoking cigarettes. She has a 8.75 pack-year smoking history. She has never used smokeless tobacco. Ms. Rogel reports no history of alcohol use.   Review of Systems CONSTITUTIONAL: No weight loss, fever, chills, weakness or fatigue.  HEENT: Eyes: No visual loss, blurred vision, double vision or yellow sclerae.No hearing loss, sneezing, congestion, runny nose or sore throat.  SKIN: No rash or itching.  CARDIOVASCULAR: per hpi RESPIRATORY: No shortness of breath, cough or sputum.  GASTROINTESTINAL: No anorexia, nausea, vomiting or diarrhea. No abdominal pain or blood.  GENITOURINARY: No burning on urination, no polyuria NEUROLOGICAL: No headache, dizziness, syncope, paralysis, ataxia, numbness or tingling in the extremities. No change in bowel or bladder control.  MUSCULOSKELETAL: No muscle, back pain, joint pain or stiffness.  LYMPHATICS: No enlarged nodes. No history of splenectomy.  PSYCHIATRIC: No history of  depression or anxiety.  ENDOCRINOLOGIC: No reports of sweating, cold or heat intolerance. No polyuria or polydipsia.  Marland Kitchen   Physical Examination Today's Vitals   03/25/21 1122  BP: 124/70  Pulse: 69  SpO2: 99%  Weight: 242 lb 12.8 oz (110.1 kg)  Height: 5' (1.524 m)   Body mass index is 47.42 kg/m.  Gen: resting comfortably, no acute distress HEENT: no scleral icterus, pupils equal round and reactive, no palptable cervical adenopathy,  CV: RRR, no m/r/g no jvd Resp: Clear to auscultation bilaterally GI: abdomen is soft, non-tender, non-distended, normal bowel sounds, no hepatosplenomegaly MSK: extremities are warm,  no edema.  Skin: warm, no rash Neuro:  no focal deficits Psych: appropriate affect   Diagnostic Studies  08/2020 coronary CTA IMPRESSION: 1. Coronary calcium score of 0.3. This was 60 percentile for age and sex matched control.   2. Normal coronary origin with right dominance.   3. Small focal LAD calcification proximal, 0-24% stenosis.   4. CAD-RADS 1. Minimal non-obstructive CAD (0-24%). Consider non-atherosclerotic causes of chest pain. Consider preventive therapy and risk factor modification.     Assessment and Plan  1. Coronary atherosclerosis - coronary CTA findings as reported above, mild plaque - continue risk factor modification. Continue ASA 81mg  daily, with noted atherosclerosis would start atorvastatin 20mg  daily regardless of LDL given known plaque.       , M.D.

## 2021-03-25 NOTE — Patient Instructions (Addendum)
Medication Instructions:  Your physician has recommended you make the following change in your medication:  Start atorvastatin 20 mg daily Continue other medications the same  Labwork: none  Testing/Procedures: none  Follow-Up: Your physician recommends that you schedule a follow-up appointment in: 1 year. You will receive a reminder call or letter in the mail in about 10 months reminding you to call and schedule your appointment. If you don't receive this letter, please contact our office.  Any Other Special Instructions Will Be Listed Below (If Applicable).  If you need a refill on your cardiac medications before your next appointment, please call your pharmacy.

## 2021-10-06 ENCOUNTER — Other Ambulatory Visit (HOSPITAL_COMMUNITY): Payer: Self-pay | Admitting: Nurse Practitioner

## 2021-10-06 ENCOUNTER — Other Ambulatory Visit: Payer: Self-pay | Admitting: Nurse Practitioner

## 2021-10-06 DIAGNOSIS — F17200 Nicotine dependence, unspecified, uncomplicated: Secondary | ICD-10-CM

## 2021-10-06 DIAGNOSIS — R918 Other nonspecific abnormal finding of lung field: Secondary | ICD-10-CM

## 2021-10-15 ENCOUNTER — Ambulatory Visit (HOSPITAL_COMMUNITY)
Admission: RE | Admit: 2021-10-15 | Discharge: 2021-10-15 | Disposition: A | Discharge status: Home / Self Care | Payer: 59 | Source: Ambulatory Visit | Attending: Nurse Practitioner | Admitting: Nurse Practitioner

## 2021-10-15 DIAGNOSIS — F17200 Nicotine dependence, unspecified, uncomplicated: Secondary | ICD-10-CM | POA: Diagnosis present

## 2021-10-15 DIAGNOSIS — R918 Other nonspecific abnormal finding of lung field: Secondary | ICD-10-CM | POA: Diagnosis present

## 2022-01-14 ENCOUNTER — Other Ambulatory Visit: Payer: Self-pay | Admitting: Cardiology

## 2022-03-19 ENCOUNTER — Other Ambulatory Visit: Payer: Self-pay | Admitting: Internal Medicine

## 2022-03-19 DIAGNOSIS — Z1231 Encounter for screening mammogram for malignant neoplasm of breast: Secondary | ICD-10-CM

## 2022-04-21 ENCOUNTER — Telehealth: Payer: Self-pay | Admitting: Cardiology

## 2022-04-21 MED ORDER — ATORVASTATIN CALCIUM 20 MG PO TABS: 20.0000 mg | ORAL_TABLET | Freq: Every day | ORAL | 0 refills | Status: DC

## 2022-04-21 NOTE — Telephone Encounter (Signed)
*  STAT* If patient is at the pharmacy, call can be transferred to refill team.   1. Which medications need to be refilled? (please list name of each medication and dose if known)   atorvastatin (LIPITOR) 20 MG tablet    2. Which pharmacy/location (including street and city if local pharmacy) is medication to be sent to?  [facility] - [place], [place] - [address]    3. Do they need a 30 day or 90 day supply?  90 day   Pt out of medication

## 2022-04-28 ENCOUNTER — Ambulatory Visit
Admission: RE | Admit: 2022-04-28 | Discharge: 2022-04-28 | Disposition: A | Discharge status: Home / Self Care | Payer: 59 | Source: Ambulatory Visit | Attending: Internal Medicine | Admitting: Internal Medicine

## 2022-04-28 DIAGNOSIS — Z1231 Encounter for screening mammogram for malignant neoplasm of breast: Secondary | ICD-10-CM | POA: Diagnosis not present

## 2022-05-05 DIAGNOSIS — R69 Illness, unspecified: Secondary | ICD-10-CM | POA: Diagnosis not present

## 2022-05-05 DIAGNOSIS — E559 Vitamin D deficiency, unspecified: Secondary | ICD-10-CM | POA: Diagnosis not present

## 2022-05-05 DIAGNOSIS — Z79899 Other long term (current) drug therapy: Secondary | ICD-10-CM | POA: Diagnosis not present

## 2022-05-05 DIAGNOSIS — E7800 Pure hypercholesterolemia, unspecified: Secondary | ICD-10-CM | POA: Diagnosis not present

## 2022-05-05 DIAGNOSIS — Z0000 Encounter for general adult medical examination without abnormal findings: Secondary | ICD-10-CM | POA: Diagnosis not present

## 2022-05-07 DIAGNOSIS — I739 Peripheral vascular disease, unspecified: Secondary | ICD-10-CM | POA: Diagnosis not present

## 2022-05-07 DIAGNOSIS — L851 Acquired keratosis [keratoderma] palmaris et plantaris: Secondary | ICD-10-CM | POA: Diagnosis not present

## 2022-05-07 DIAGNOSIS — L84 Corns and callosities: Secondary | ICD-10-CM | POA: Diagnosis not present

## 2022-05-07 DIAGNOSIS — M2042 Other hammer toe(s) (acquired), left foot: Secondary | ICD-10-CM | POA: Diagnosis not present

## 2022-05-07 DIAGNOSIS — M792 Neuralgia and neuritis, unspecified: Secondary | ICD-10-CM | POA: Diagnosis not present

## 2022-05-07 DIAGNOSIS — M2010 Hallux valgus (acquired), unspecified foot: Secondary | ICD-10-CM | POA: Diagnosis not present

## 2022-05-19 ENCOUNTER — Encounter: Payer: Self-pay | Admitting: Cardiology

## 2022-05-19 ENCOUNTER — Ambulatory Visit: Payer: 59 | Attending: Cardiology | Admitting: Cardiology

## 2022-05-19 VITALS — BP 116/70 | HR 73 | Ht 60.0 in | Wt 250.0 lb

## 2022-05-19 DIAGNOSIS — I2510 Atherosclerotic heart disease of native coronary artery without angina pectoris: Secondary | ICD-10-CM

## 2022-05-19 NOTE — Patient Instructions (Signed)
Medication Instructions:  Your physician recommends that you continue on your current medications as directed. Please refer to the Current Medication list given to you today.   Labwork: None  Testing/Procedures: None  Follow-Up: Follow up with Dr. Wyline Mood in 1 year- [place] Office  Any Other Special Instructions Will Be Listed Below (If Applicable).     If you need a refill on your cardiac medications before your next appointment, please call your pharmacy.

## 2022-05-19 NOTE — Progress Notes (Signed)
Clinical Summary [name] is a 56 y.o.female seen today for follow up of the following medical problems.      1. Chest pain/CAD - ER visit 05/30/2020 [facility] witih chest pain  - trop 42-->47-->78-->86 - EKG per report sinus brady, nonspec TWIs CT no PE, +pulm nodules   - was not able to be admitted for further evaluation due to outside commitments.   CAD risk factors: + tobacco x 37 years     08/2020 coronary CTA: small focal LAD 0-24% - some chest pains at times, typically with stress or being upset. Symptoms are not exertional   - compliant with aspirin and lipitor. LDL 82 04/2022 - some chest pains when she gets upset.       2. Lung nodules - noted on CT PE, recs were for repeat scan in 3-6 months  - followed by pcp  - 10/2021 CT stable, no additional imaging needed            Patricia Thompson is her sister, who is also a patient of mine Past Medical History:  Diagnosis Date   GERD (gastroesophageal reflux disease)    occasional   OA (osteoarthritis)    knees     Allergies  Allergen Reactions   Vicodin [Hydrocodone-Acetaminophen] Nausea And Vomiting     Current Outpatient Medications  Medication Sig Dispense Refill   acetaminophen (TYLENOL) 500 MG tablet Take 500 mg by mouth at bedtime.     aspirin EC 81 MG tablet Take 81 mg by mouth daily. Swallow whole.     atorvastatin (LIPITOR) 20 MG tablet Take 1 tablet (20 mg total) by mouth daily. 90 tablet 0   Biotin 32440 MCG TABS Take 20,000 mcg by mouth daily.     methocarbamol (ROBAXIN) 500 MG tablet Take 1 tablet (500 mg total) by mouth every 6 (six) hours as needed for muscle spasms. 40 tablet 0   omeprazole (PRILOSEC) 20 MG capsule Take 20 mg by mouth as needed.     No current facility-administered medications for this visit.     Past Surgical History:  Procedure Laterality Date   COLONOSCOPY  06/2018   DILATION AND CURETTAGE OF UTERUS  age 32   TONSILLECTOMY  age 75   TOTAL KNEE  ARTHROPLASTY Right 08/14/2018   Procedure: TOTAL KNEE ARTHROPLASTY;  Surgeon: Ollen Gross, MD;  Location: [facility];  Service: Orthopedics;  Laterality: Right;      Allergies  Allergen Reactions   Vicodin [Hydrocodone-Acetaminophen] Nausea And Vomiting      Family History  Problem Relation Age of Onset   Heart disease Mother    Heart attack Mother    Heart disease Father    Heart attack Father    Breast cancer Maternal Grandmother      Social History Patricia Thompson reports that she has been smoking cigarettes. She has a 8.75 pack-year smoking history. She has never used smokeless tobacco. Patricia Thompson reports no history of alcohol use.   Review of Systems CONSTITUTIONAL: No weight loss, fever, chills, weakness or fatigue.  HEENT: Eyes: No visual loss, blurred vision, double vision or yellow sclerae.No hearing loss, sneezing, congestion, runny nose or sore throat.  SKIN: No rash or itching.  CARDIOVASCULAR: per hpi RESPIRATORY: No shortness of breath, cough or sputum.  GASTROINTESTINAL: No anorexia, nausea, vomiting or diarrhea. No abdominal pain or blood.  GENITOURINARY: No burning on urination, no polyuria NEUROLOGICAL: No headache, dizziness, syncope, paralysis, ataxia, numbness or tingling in the  extremities. No change in bowel or bladder control.  MUSCULOSKELETAL: No muscle, back pain, joint pain or stiffness.  LYMPHATICS: No enlarged nodes. No history of splenectomy.  PSYCHIATRIC: No history of depression or anxiety.  ENDOCRINOLOGIC: No reports of sweating, cold or heat intolerance. No polyuria or polydipsia.  Marland Kitchen   Physical Examination Today's Vitals   05/19/22 1551  BP: 116/70  Pulse: 73  SpO2: 99%  Weight: 250 lb (113.4 kg)  Height: 5' (1.524 m)   Body mass index is 48.82 kg/m.  Gen: resting comfortably, no acute distress HEENT: no scleral icterus, pupils equal round and reactive, no palptable cervical adenopathy,  CV: RRR, no m/r/g no jvd Resp: Clear to  auscultation bilaterally GI: abdomen is soft, non-tender, non-distended, normal bowel sounds, no hepatosplenomegaly MSK: extremities are warm, no edema.  Skin: warm, no rash Neuro:  no focal deficits Psych: appropriate affect   Diagnostic Studies  08/2020 coronary CTA IMPRESSION: 1. Coronary calcium score of 0.3. This was 82 percentile for age and sex matched control.   2. Normal coronary origin with right dominance.   3. Small focal LAD calcification proximal, 0-24% stenosis.   4. CAD-RADS 1. Minimal non-obstructive CAD (0-24%). Consider non-atherosclerotic causes of chest pain. Consider preventive therapy and risk factor modification.   Assessment and Plan  1. Coronary atherosclerosis - coronary CTA findings as reported above, mild plaque - no recent cardiac symptoms - she is on ASA, statin. Continue current meds   F/u 1 year      Antoine Poche, M.D.

## 2022-07-30 DIAGNOSIS — M792 Neuralgia and neuritis, unspecified: Secondary | ICD-10-CM | POA: Diagnosis not present

## 2022-07-30 DIAGNOSIS — I739 Peripheral vascular disease, unspecified: Secondary | ICD-10-CM | POA: Diagnosis not present

## 2022-07-30 DIAGNOSIS — M2042 Other hammer toe(s) (acquired), left foot: Secondary | ICD-10-CM | POA: Diagnosis not present

## 2022-07-30 DIAGNOSIS — L851 Acquired keratosis [keratoderma] palmaris et plantaris: Secondary | ICD-10-CM | POA: Diagnosis not present

## 2022-07-30 DIAGNOSIS — B351 Tinea unguium: Secondary | ICD-10-CM | POA: Diagnosis not present

## 2022-07-30 DIAGNOSIS — M2010 Hallux valgus (acquired), unspecified foot: Secondary | ICD-10-CM | POA: Diagnosis not present

## 2022-10-01 DIAGNOSIS — M2010 Hallux valgus (acquired), unspecified foot: Secondary | ICD-10-CM | POA: Diagnosis not present

## 2022-10-01 DIAGNOSIS — M792 Neuralgia and neuritis, unspecified: Secondary | ICD-10-CM | POA: Diagnosis not present

## 2022-10-01 DIAGNOSIS — M2042 Other hammer toe(s) (acquired), left foot: Secondary | ICD-10-CM | POA: Diagnosis not present

## 2022-10-01 DIAGNOSIS — I739 Peripheral vascular disease, unspecified: Secondary | ICD-10-CM | POA: Diagnosis not present

## 2022-10-01 DIAGNOSIS — L851 Acquired keratosis [keratoderma] palmaris et plantaris: Secondary | ICD-10-CM | POA: Diagnosis not present

## 2022-10-01 DIAGNOSIS — B351 Tinea unguium: Secondary | ICD-10-CM | POA: Diagnosis not present

## 2022-10-02 DIAGNOSIS — I2510 Atherosclerotic heart disease of native coronary artery without angina pectoris: Secondary | ICD-10-CM | POA: Diagnosis not present

## 2022-10-02 DIAGNOSIS — E559 Vitamin D deficiency, unspecified: Secondary | ICD-10-CM | POA: Diagnosis not present

## 2022-10-02 DIAGNOSIS — Z809 Family history of malignant neoplasm, unspecified: Secondary | ICD-10-CM | POA: Diagnosis not present

## 2022-10-02 DIAGNOSIS — Z7982 Long term (current) use of aspirin: Secondary | ICD-10-CM | POA: Diagnosis not present

## 2022-10-02 DIAGNOSIS — Z833 Family history of diabetes mellitus: Secondary | ICD-10-CM | POA: Diagnosis not present

## 2022-10-02 DIAGNOSIS — M1990 Unspecified osteoarthritis, unspecified site: Secondary | ICD-10-CM | POA: Diagnosis not present

## 2022-10-02 DIAGNOSIS — I739 Peripheral vascular disease, unspecified: Secondary | ICD-10-CM | POA: Diagnosis not present

## 2022-10-02 DIAGNOSIS — F17210 Nicotine dependence, cigarettes, uncomplicated: Secondary | ICD-10-CM | POA: Diagnosis not present

## 2022-10-02 DIAGNOSIS — I10 Essential (primary) hypertension: Secondary | ICD-10-CM | POA: Diagnosis not present

## 2022-10-02 DIAGNOSIS — E785 Hyperlipidemia, unspecified: Secondary | ICD-10-CM | POA: Diagnosis not present

## 2022-10-02 DIAGNOSIS — F419 Anxiety disorder, unspecified: Secondary | ICD-10-CM | POA: Diagnosis not present

## 2022-10-02 DIAGNOSIS — Z8249 Family history of ischemic heart disease and other diseases of the circulatory system: Secondary | ICD-10-CM | POA: Diagnosis not present

## 2022-10-22 ENCOUNTER — Other Ambulatory Visit: Payer: Self-pay

## 2022-10-22 ENCOUNTER — Telehealth: Payer: Self-pay | Admitting: Cardiology

## 2022-10-22 MED ORDER — ATORVASTATIN CALCIUM 20 MG PO TABS: 20.0000 mg | ORAL_TABLET | Freq: Every day | ORAL | 2 refills | Status: DC

## 2022-10-22 NOTE — Telephone Encounter (Signed)
*  STAT* If patient is at the pharmacy, call can be transferred to refill team.   1. Which medications need to be refilled? (please list name of each medication and dose if known)  atorvastatin (LIPITOR) 20 MG tablet  2. Which pharmacy/location (including street and city if local pharmacy) is medication to be sent to? [facility] - [place], [place] - [address]  3. Do they need a 30 day or 90 day supply?  90 day supply  Patient states she is completely out of medication.

## 2022-12-22 DIAGNOSIS — M722 Plantar fascial fibromatosis: Secondary | ICD-10-CM | POA: Diagnosis not present

## 2022-12-22 DIAGNOSIS — Z720 Tobacco use: Secondary | ICD-10-CM | POA: Diagnosis not present

## 2022-12-22 DIAGNOSIS — M79672 Pain in left foot: Secondary | ICD-10-CM | POA: Diagnosis not present

## 2022-12-22 DIAGNOSIS — F17200 Nicotine dependence, unspecified, uncomplicated: Secondary | ICD-10-CM | POA: Diagnosis not present

## 2022-12-22 DIAGNOSIS — M7989 Other specified soft tissue disorders: Secondary | ICD-10-CM | POA: Diagnosis not present

## 2022-12-22 DIAGNOSIS — I10 Essential (primary) hypertension: Secondary | ICD-10-CM | POA: Diagnosis not present

## 2022-12-22 DIAGNOSIS — M7732 Calcaneal spur, left foot: Secondary | ICD-10-CM | POA: Diagnosis not present

## 2022-12-24 DIAGNOSIS — M79672 Pain in left foot: Secondary | ICD-10-CM | POA: Diagnosis not present

## 2022-12-24 DIAGNOSIS — E7800 Pure hypercholesterolemia, unspecified: Secondary | ICD-10-CM | POA: Diagnosis not present

## 2022-12-24 DIAGNOSIS — Z885 Allergy status to narcotic agent status: Secondary | ICD-10-CM | POA: Diagnosis not present

## 2022-12-24 DIAGNOSIS — M722 Plantar fascial fibromatosis: Secondary | ICD-10-CM | POA: Diagnosis not present

## 2022-12-24 DIAGNOSIS — Z79899 Other long term (current) drug therapy: Secondary | ICD-10-CM | POA: Diagnosis not present

## 2022-12-24 DIAGNOSIS — Z7982 Long term (current) use of aspirin: Secondary | ICD-10-CM | POA: Diagnosis not present

## 2022-12-26 DIAGNOSIS — M722 Plantar fascial fibromatosis: Secondary | ICD-10-CM | POA: Diagnosis not present

## 2022-12-26 DIAGNOSIS — M79672 Pain in left foot: Secondary | ICD-10-CM | POA: Diagnosis not present

## 2022-12-26 DIAGNOSIS — M7732 Calcaneal spur, left foot: Secondary | ICD-10-CM | POA: Diagnosis not present

## 2022-12-26 DIAGNOSIS — E7800 Pure hypercholesterolemia, unspecified: Secondary | ICD-10-CM | POA: Diagnosis not present

## 2023-01-14 DIAGNOSIS — L84 Corns and callosities: Secondary | ICD-10-CM | POA: Diagnosis not present

## 2023-01-14 DIAGNOSIS — I739 Peripheral vascular disease, unspecified: Secondary | ICD-10-CM | POA: Diagnosis not present

## 2023-01-14 DIAGNOSIS — M2042 Other hammer toe(s) (acquired), left foot: Secondary | ICD-10-CM | POA: Diagnosis not present

## 2023-01-14 DIAGNOSIS — L851 Acquired keratosis [keratoderma] palmaris et plantaris: Secondary | ICD-10-CM | POA: Diagnosis not present

## 2023-01-14 DIAGNOSIS — M792 Neuralgia and neuritis, unspecified: Secondary | ICD-10-CM | POA: Diagnosis not present

## 2023-01-14 DIAGNOSIS — B351 Tinea unguium: Secondary | ICD-10-CM | POA: Diagnosis not present

## 2023-01-14 DIAGNOSIS — M2010 Hallux valgus (acquired), unspecified foot: Secondary | ICD-10-CM | POA: Diagnosis not present

## 2023-02-22 ENCOUNTER — Other Ambulatory Visit: Payer: Self-pay | Admitting: Internal Medicine

## 2023-02-22 DIAGNOSIS — Z1231 Encounter for screening mammogram for malignant neoplasm of breast: Secondary | ICD-10-CM

## 2023-03-01 DIAGNOSIS — M2042 Other hammer toe(s) (acquired), left foot: Secondary | ICD-10-CM | POA: Diagnosis not present

## 2023-03-01 DIAGNOSIS — S92045A Nondisplaced other fracture of tuberosity of left calcaneus, initial encounter for closed fracture: Secondary | ICD-10-CM | POA: Diagnosis not present

## 2023-03-01 DIAGNOSIS — L851 Acquired keratosis [keratoderma] palmaris et plantaris: Secondary | ICD-10-CM | POA: Diagnosis not present

## 2023-03-01 DIAGNOSIS — R262 Difficulty in walking, not elsewhere classified: Secondary | ICD-10-CM | POA: Diagnosis not present

## 2023-03-01 DIAGNOSIS — M792 Neuralgia and neuritis, unspecified: Secondary | ICD-10-CM | POA: Diagnosis not present

## 2023-03-01 DIAGNOSIS — B351 Tinea unguium: Secondary | ICD-10-CM | POA: Diagnosis not present

## 2023-03-01 DIAGNOSIS — M2010 Hallux valgus (acquired), unspecified foot: Secondary | ICD-10-CM | POA: Diagnosis not present

## 2023-03-02 IMAGING — CT CT HEART MORP W/ CTA COR W/ SCORE W/ CA W/CM &/OR W/O CM
4 of 7 series · 8 of 20 positions shown, 9 images · IV contrast (APPLIED)
Comparison: None.
COMPARISON: None.

Addendum:
EXAM:
OVER-READ INTERPRETATION  CT CHEST

The following report is an over-read performed by radiologist Dr.
Ashleigh Yim [REDACTED] on 08/07/2020. This
over-read does not include interpretation of cardiac or coronary
anatomy or pathology. The coronary calcium score/coronary CTA
interpretation by the cardiologist is attached.
CLINICAL DATA: 54 year old with chest pain
Cardiac/Coronary  CTA
TECHNIQUE: The patient was scanned on a Phillips Force scanner.

[Series 6: best diast · axial · 0.39mm/px · z∈[-120,-83]mm · 2 of 278 slices shown, 3 images]
[im 93/278  vessel]
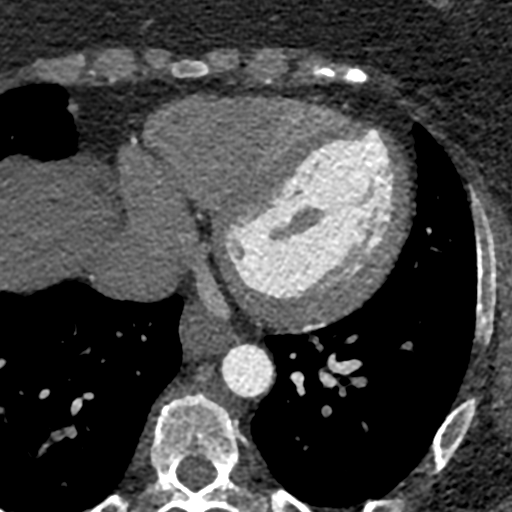
[im 93/278  lung]
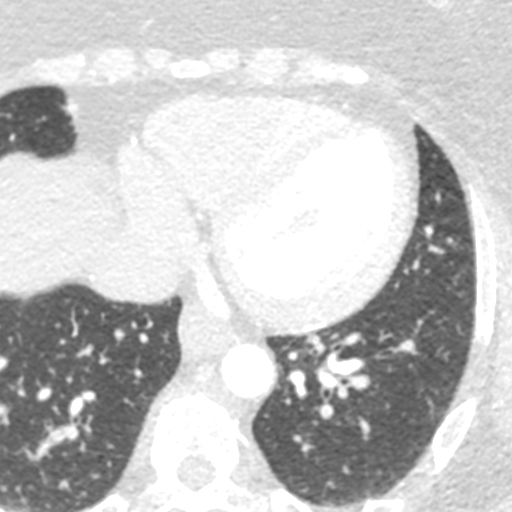
[im 185/278  vessel]
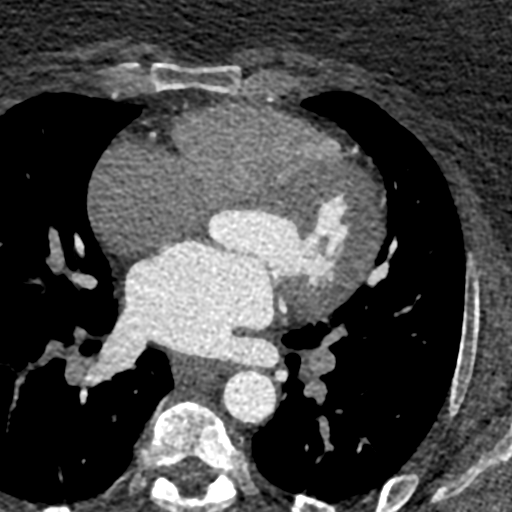

[Series 7: best syst · axial · 0.39mm/px · z∈[-120,-83]mm · 2 of 278 slices shown]
[im 93/278  vessel]
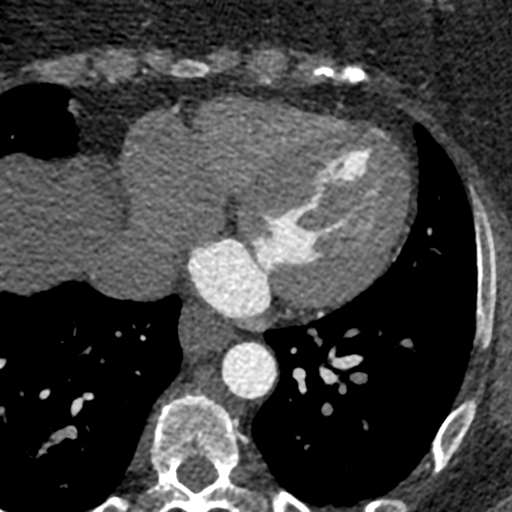
[im 185/278  vessel]
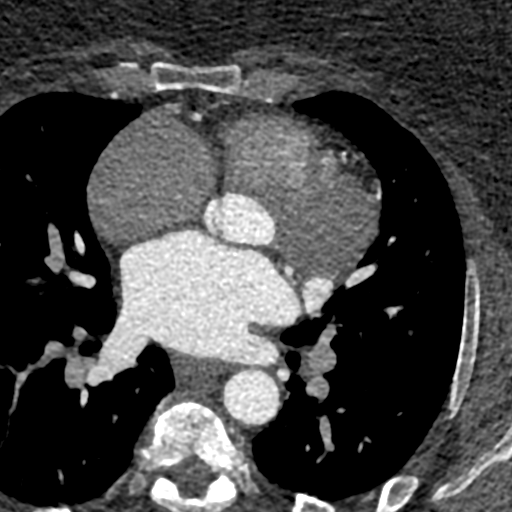

[Series 8: ts diast sharp · axial · 0.39mm/px · z∈[-120,-83]mm · 2 of 278 slices shown]
[im 93/278  lung]
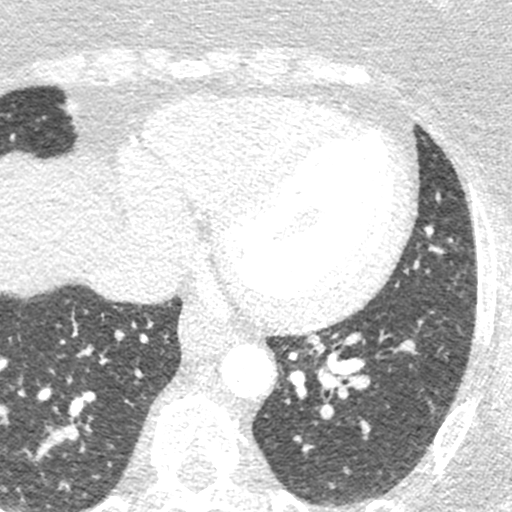
[im 185/278  lung]
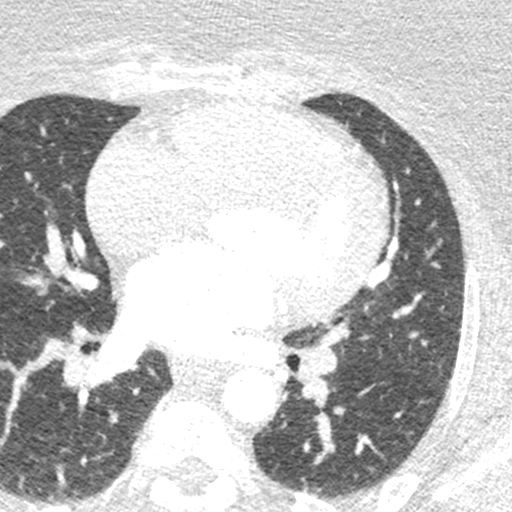

[Series 9: ts syst sharp · axial · 0.39mm/px · z∈[-120,-83]mm · 2 of 278 slices shown]
[im 93/278  lung]
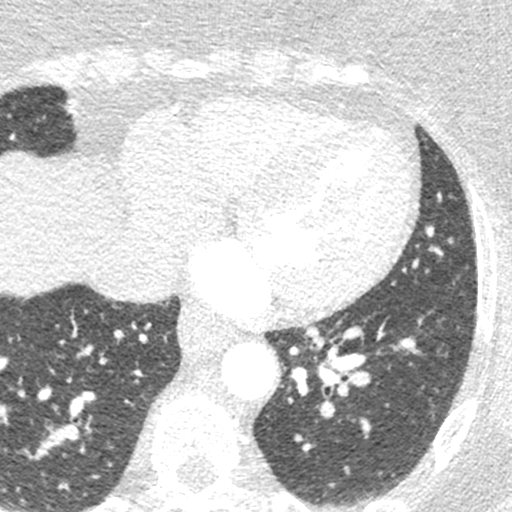
[im 185/278  lung]
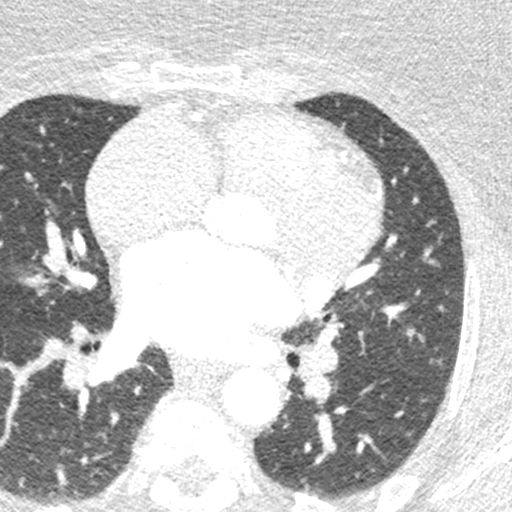

[8 of 20 positions shown; findings below may reference images not displayed]

FINDINGS: Within the visualized portions of the thorax there are no suspicious
appearing pulmonary nodules or masses, there is no acute
consolidative airspace disease, no pleural effusions, no
pneumothorax and no lymphadenopathy. Visualized portions of the
upper abdomen are unremarkable. There are no aggressive appearing
lytic or blastic lesions noted in the visualized portions of the
skeleton.
IMPRESSION: 1. No significant incidental noncardiac findings are noted.
FINDINGS: A 120 kV prospective scan was triggered in the descending thoracic
aorta at 111 HU's. Axial non-contrast 3 mm slices were carried out
through the heart. The data set was analyzed on a dedicated work
station and scored using the Agatson method. Gantry rotation speed
was 250 msecs and collimation was .6 mm. No beta blockade and 0.8 mg
of sl NTG was given. The 3D data set was reconstructed in 5%
intervals of the 67-82 % of the R-R cycle. Diastolic phases were
analyzed on a dedicated work station using MPR, MIP and VRT modes.
The patient received 80 cc of contrast.

Aorta:  Normal size.  No calcifications.  No dissection.

Aortic Valve:  Trileaflet.  No calcifications.

Coronary Arteries:  Normal coronary origin.  Right dominance.

RCA is a dominant artery that gives rise to PDA and PLA. There is no
plaque. (Ostium is best seen in 40% phase).

Left main is a large artery that gives rise to LAD and LCX arteries.

LAD is a large vessel that has small focal LAD calcification
proximal, 0-24% stenosis.

LCX is a non-dominant artery that gives rise to one large OM1
branch. There is no plaque.

Other findings:

Normal pulmonary vein drainage into the left atrium.

Normal left atrial appendage without a thrombus.

Normal size of the pulmonary artery.

Please see radiology report for non cardiac findings.
IMPRESSION: 1. Coronary calcium score of 0.3. This was 79 percentile for age and
sex matched control.

2. Normal coronary origin with right dominance.

3. Small focal LAD calcification proximal, 0-24% stenosis.

4. CAD-RADS 1. Minimal non-obstructive CAD (0-24%). Consider
non-atherosclerotic causes of chest pain. Consider preventive
therapy and risk factor modification.

*** End of Addendum ***
EXAM:
OVER-READ INTERPRETATION  CT CHEST

The following report is an over-read performed by radiologist Dr.
Ashleigh Yim [REDACTED] on 08/07/2020. This
over-read does not include interpretation of cardiac or coronary
anatomy or pathology. The coronary calcium score/coronary CTA
interpretation by the cardiologist is attached.
FINDINGS: Within the visualized portions of the thorax there are no suspicious
appearing pulmonary nodules or masses, there is no acute
consolidative airspace disease, no pleural effusions, no
pneumothorax and no lymphadenopathy. Visualized portions of the
upper abdomen are unremarkable. There are no aggressive appearing
lytic or blastic lesions noted in the visualized portions of the
skeleton.
IMPRESSION: 1. No significant incidental noncardiac findings are noted.

## 2023-03-22 DIAGNOSIS — S92045D Nondisplaced other fracture of tuberosity of left calcaneus, subsequent encounter for fracture with routine healing: Secondary | ICD-10-CM | POA: Diagnosis not present

## 2023-03-22 DIAGNOSIS — Q828 Other specified congenital malformations of skin: Secondary | ICD-10-CM | POA: Diagnosis not present

## 2023-03-22 DIAGNOSIS — R262 Difficulty in walking, not elsewhere classified: Secondary | ICD-10-CM | POA: Diagnosis not present

## 2023-03-22 DIAGNOSIS — B351 Tinea unguium: Secondary | ICD-10-CM | POA: Diagnosis not present

## 2023-03-22 DIAGNOSIS — M792 Neuralgia and neuritis, unspecified: Secondary | ICD-10-CM | POA: Diagnosis not present

## 2023-03-22 DIAGNOSIS — L851 Acquired keratosis [keratoderma] palmaris et plantaris: Secondary | ICD-10-CM | POA: Diagnosis not present

## 2023-03-22 DIAGNOSIS — M2010 Hallux valgus (acquired), unspecified foot: Secondary | ICD-10-CM | POA: Diagnosis not present

## 2023-03-22 DIAGNOSIS — M2042 Other hammer toe(s) (acquired), left foot: Secondary | ICD-10-CM | POA: Diagnosis not present

## 2023-04-22 DIAGNOSIS — M722 Plantar fascial fibromatosis: Secondary | ICD-10-CM | POA: Diagnosis not present

## 2023-04-22 DIAGNOSIS — M84375A Stress fracture, left foot, initial encounter for fracture: Secondary | ICD-10-CM | POA: Diagnosis not present

## 2023-05-13 ENCOUNTER — Ambulatory Visit
Admission: RE | Admit: 2023-05-13 | Discharge: 2023-05-13 | Disposition: A | Discharge status: Home / Self Care | Payer: 59 | Source: Ambulatory Visit | Attending: Internal Medicine | Admitting: Internal Medicine

## 2023-05-13 DIAGNOSIS — Z1231 Encounter for screening mammogram for malignant neoplasm of breast: Secondary | ICD-10-CM | POA: Diagnosis not present

## 2023-05-19 DIAGNOSIS — Z01419 Encounter for gynecological examination (general) (routine) without abnormal findings: Secondary | ICD-10-CM | POA: Diagnosis not present

## 2023-05-19 DIAGNOSIS — R87610 Atypical squamous cells of undetermined significance on cytologic smear of cervix (ASC-US): Secondary | ICD-10-CM | POA: Diagnosis not present

## 2023-05-25 DIAGNOSIS — E7800 Pure hypercholesterolemia, unspecified: Secondary | ICD-10-CM | POA: Diagnosis not present

## 2023-05-25 DIAGNOSIS — Z0000 Encounter for general adult medical examination without abnormal findings: Secondary | ICD-10-CM | POA: Diagnosis not present

## 2023-05-25 DIAGNOSIS — E559 Vitamin D deficiency, unspecified: Secondary | ICD-10-CM | POA: Diagnosis not present

## 2023-05-25 DIAGNOSIS — F419 Anxiety disorder, unspecified: Secondary | ICD-10-CM | POA: Diagnosis not present

## 2023-05-25 DIAGNOSIS — Z79899 Other long term (current) drug therapy: Secondary | ICD-10-CM | POA: Diagnosis not present

## 2023-08-09 ENCOUNTER — Telehealth: Payer: Self-pay | Admitting: Cardiology

## 2023-08-09 ENCOUNTER — Ambulatory Visit: Payer: 59 | Admitting: Cardiology

## 2023-08-09 NOTE — Telephone Encounter (Signed)
     1. Which medications need to be refilled? (please list name of each medication and dose if known) atorvastatin (LIPITOR) 20 MG tablet    2. Would you like to learn more about the convenience, safety, & potential cost savings by using the [facility]? No   Maybe     4. Which pharmacy/location (including street and city if local pharmacy) is medication to be sent to?[facility]  [place]   5. Do they need a 30 day or 90 day supply? 90  Patient only has 1 pill left

## 2023-08-11 ENCOUNTER — Other Ambulatory Visit: Payer: Self-pay | Admitting: Cardiology

## 2023-08-12 NOTE — Telephone Encounter (Signed)
 Patient called the [place] office regarding the refill on atorvastatin (LIPITOR) 20 MG tablet . [facility] told patient that they didn't receive the refill.   [facility] pharmacy

## 2023-10-11 ENCOUNTER — Encounter: Payer: Self-pay | Admitting: Cardiology

## 2023-10-11 ENCOUNTER — Ambulatory Visit: Attending: Cardiology | Admitting: Cardiology

## 2023-10-11 VITALS — BP 130/76 | HR 66 | Ht 60.0 in | Wt 225.8 lb

## 2023-10-11 DIAGNOSIS — I2510 Atherosclerotic heart disease of native coronary artery without angina pectoris: Secondary | ICD-10-CM | POA: Diagnosis present

## 2023-10-11 DIAGNOSIS — E782 Mixed hyperlipidemia: Secondary | ICD-10-CM | POA: Insufficient documentation

## 2023-10-11 MED ORDER — ATORVASTATIN CALCIUM 20 MG PO TABS: 20.0000 mg | ORAL_TABLET | Freq: Every day | ORAL | 2 refills | Status: AC

## 2023-10-11 NOTE — Patient Instructions (Addendum)
 Medication Instructions:   Atorvastatin  refilled today  Continue all other medications.     Labwork:  none  Testing/Procedures:  none  Follow-Up:  Your physician wants you to follow up in:  1 year.  You should receive a recall letter in the mail about 2 months prior to the time you are due.  If you don't receive this, please call our office to schedule your follow up appointment.      Any Other Special Instructions Will Be Listed Below (If Applicable).   If you need a refill on your cardiac medications before your next appointment, please call your pharmacy.

## 2023-10-11 NOTE — Progress Notes (Signed)
 Clinical Summary Patricia Thompson is a 58 y.o.female seen today for follow up of the following medical problems.      1. Chest pain/CAD - ER visit 05/30/2020 [facility] witih chest pain  - trop 42-->47-->78-->86 - EKG per report sinus brady, nonspec TWIs CT no PE, +pulm nodules   - was not able to be admitted for further evaluation due to outside commitments.   CAD risk factors: + tobacco x 37 years  08/2020 coronary CTA: small focal LAD 0-24%   - Compliant with ASA and statin - 05/2023 TC 131 TG 52 HDL 34 LDL 85  - chronic chest pains with stressed and getting upset, no exertional symptoms. Overall unchanged.               Patricia Thompson is her sister, who is also a patient of mine Past Medical History:  Diagnosis Date   GERD (gastroesophageal reflux disease)    occasional   OA (osteoarthritis)    knees     Allergies  Allergen Reactions   Vicodin [Hydrocodone-Acetaminophen ] Nausea And Vomiting   Hydrocodone-Acetaminophen  Nausea And Vomiting     Current Outpatient Medications  Medication Sig Dispense Refill   acetaminophen  (TYLENOL ) 500 MG tablet Take 500 mg by mouth at bedtime.     aspirin  EC 81 MG tablet Take 81 mg by mouth daily. Swallow whole.     atorvastatin  (LIPITOR) 20 MG tablet Take 1 tablet by mouth once daily 90 tablet 1   Biotin 16109 MCG TABS Take 20,000 mcg by mouth daily. (Patient not taking: Reported on 05/19/2022)     methocarbamol  (ROBAXIN ) 500 MG tablet Take 1 tablet (500 mg total) by mouth every 6 (six) hours as needed for muscle spasms. (Patient not taking: Reported on 05/19/2022) 40 tablet 0   omeprazole (PRILOSEC) 20 MG capsule Take 20 mg by mouth as needed.     Vitamin D, Ergocalciferol, 50000 units CAPS Take 1 capsule by mouth once a week.     No current facility-administered medications for this visit.     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 She has a 8.8 pack-year smoking history. She has never used smokeless tobacco. Patricia Thompson reports no history of alcohol use.     Physical Examination Today's Vitals   10/11/23 1603  BP: 130/76  Pulse: 66  SpO2: 98%  Weight: 225 lb 12.8 oz (102.4 kg)  Height: 5' (1.524 m)   Body mass index is 44.1 kg/m.  Gen: resting comfortably, no acute distress HEENT: no scleral icterus, pupils equal round and reactive, no palptable cervical adenopathy,  CV: RRR, no m/rg, no jvd Resp: Clear to auscultation bilaterally GI: abdomen is soft, non-tender, non-distended, normal bowel sounds, no  hepatosplenomegaly MSK: extremities are warm, no edema.  Skin: warm, no rash Neuro:  no focal deficits Psych: appropriate affect   Diagnostic Studies  08/2020 coronary CTA IMPRESSION: 1. Coronary calcium  score of 0.3. This was 76 percentile for age and sex matched control.   2. Normal coronary origin with right dominance.   3. Small focal LAD calcification proximal, 0-24% stenosis.   4. CAD-RADS 1. Minimal non-obstructive CAD (0-24%). Consider non-atherosclerotic causes of chest pain. Consider preventive therapy and risk factor modification.   Assessment and Plan   1. Coronary atherosclerosis - coronary CTA findings as reported above, mild plaque - denies any symptoms - continue current  meds, continue risk factor modification.  - EKG today shows SR, no acute ischemic changes  2.HLD - LDL is at goal, continue current meds       Laurann Pollock, M.D.

## 2024-04-24 ENCOUNTER — Other Ambulatory Visit: Payer: Self-pay | Admitting: Nurse Practitioner

## 2024-04-24 DIAGNOSIS — Z1231 Encounter for screening mammogram for malignant neoplasm of breast: Secondary | ICD-10-CM

## 2024-05-21 ENCOUNTER — Ambulatory Visit
Admission: RE | Admit: 2024-05-21 | Discharge: 2024-05-21 | Disposition: A | Source: Ambulatory Visit | Attending: Nurse Practitioner | Admitting: Nurse Practitioner

## 2024-05-21 DIAGNOSIS — Z1231 Encounter for screening mammogram for malignant neoplasm of breast: Secondary | ICD-10-CM
